# Patient Record
Sex: Female | Born: 1950 | Race: Black or African American | Hispanic: No | Marital: Married | Smoking: Never smoker
Health system: Southern US, Community
[De-identification: ages and names within clinical notes are randomized; demographics above are authoritative.]

## PROBLEM LIST (undated history)

## (undated) DIAGNOSIS — D649 Anemia, unspecified: Secondary | ICD-10-CM

## (undated) DIAGNOSIS — H409 Unspecified glaucoma: Secondary | ICD-10-CM

## (undated) DIAGNOSIS — E079 Disorder of thyroid, unspecified: Secondary | ICD-10-CM

## (undated) DIAGNOSIS — F419 Anxiety disorder, unspecified: Secondary | ICD-10-CM

## (undated) HISTORY — PX: COLONOSCOPY: SHX174

---

## 2011-01-29 ENCOUNTER — Emergency Department (INDEPENDENT_AMBULATORY_CARE_PROVIDER_SITE_OTHER): Payer: BC Managed Care – PPO

## 2011-01-29 ENCOUNTER — Emergency Department (HOSPITAL_BASED_OUTPATIENT_CLINIC_OR_DEPARTMENT_OTHER)
Admission: EM | Admit: 2011-01-29 | Discharge: 2011-01-29 | Disposition: A | Discharge status: Home / Self Care | Payer: BC Managed Care – PPO | Attending: Emergency Medicine | Admitting: Emergency Medicine

## 2011-01-29 DIAGNOSIS — R0789 Other chest pain: Secondary | ICD-10-CM | POA: Insufficient documentation

## 2011-01-29 DIAGNOSIS — R079 Chest pain, unspecified: Secondary | ICD-10-CM

## 2011-01-29 DIAGNOSIS — F411 Generalized anxiety disorder: Secondary | ICD-10-CM | POA: Insufficient documentation

## 2011-01-29 LAB — CBC
HCT: 34.8 % — ABNORMAL LOW (ref 36.0–46.0)
Hemoglobin: 11.5 g/dL — ABNORMAL LOW (ref 12.0–15.0)
MCHC: 33 g/dL (ref 30.0–36.0)
RBC: 3.89 MIL/uL (ref 3.87–5.11)

## 2011-01-29 LAB — DIFFERENTIAL
Basophils Absolute: 0.1 10*3/uL (ref 0.0–0.1)
Basophils Relative: 1 % (ref 0–1)
Lymphocytes Relative: 28 % (ref 12–46)
Monocytes Absolute: 0.5 10*3/uL (ref 0.1–1.0)
Monocytes Relative: 8 % (ref 3–12)
Neutro Abs: 4.1 10*3/uL (ref 1.7–7.7)
Neutrophils Relative %: 64 % (ref 43–77)

## 2011-01-29 LAB — TROPONIN I: Troponin I: <0.3 ng/mL (ref <0.30)

## 2011-01-29 LAB — BASIC METABOLIC PANEL
Calcium: 9.9 mg/dL (ref 8.4–10.5)
Creatinine, Ser: 0.7 mg/dL (ref 0.4–1.2)
GFR calc Af Amer: >60 mL/min (ref >60)
GFR calc non Af Amer: >60 mL/min (ref >60)
Sodium: 140 mEq/L (ref 135–145)

## 2011-01-29 LAB — CK TOTAL AND CKMB (NOT AT ARMC)
CK, MB: 2 ng/mL (ref 0.3–4.0)
Total CK: 137 U/L (ref 7–177)

## 2013-02-26 ENCOUNTER — Other Ambulatory Visit (HOSPITAL_COMMUNITY): Payer: Self-pay | Admitting: Endocrinology

## 2013-02-26 DIAGNOSIS — E0590 Thyrotoxicosis, unspecified without thyrotoxic crisis or storm: Secondary | ICD-10-CM

## 2013-03-12 ENCOUNTER — Encounter (HOSPITAL_COMMUNITY)
Admission: RE | Admit: 2013-03-12 | Discharge: 2013-03-12 | Disposition: A | Discharge status: Home / Self Care | Payer: BC Managed Care – PPO | Source: Ambulatory Visit | Attending: Endocrinology | Admitting: Endocrinology

## 2013-03-12 DIAGNOSIS — E0590 Thyrotoxicosis, unspecified without thyrotoxic crisis or storm: Secondary | ICD-10-CM | POA: Insufficient documentation

## 2013-03-13 ENCOUNTER — Encounter (HOSPITAL_COMMUNITY)
Admission: RE | Admit: 2013-03-13 | Discharge: 2013-03-13 | Disposition: A | Discharge status: Home / Self Care | Payer: BC Managed Care – PPO | Source: Ambulatory Visit | Attending: Endocrinology | Admitting: Endocrinology

## 2013-03-13 ENCOUNTER — Encounter (HOSPITAL_COMMUNITY): Payer: Self-pay

## 2013-03-13 MED ORDER — SODIUM IODIDE I 131 CAPSULE
16.2000 | Freq: Once | INTRAVENOUS | Status: AC | PRN
  Administered 2013-03-12: 16.2 via ORAL

## 2013-03-13 MED ORDER — SODIUM PERTECHNETATE TC 99M INJECTION
11.0000 | Freq: Once | INTRAVENOUS | Status: AC | PRN
  Administered 2013-03-13: 11 via INTRAVENOUS

## 2013-03-25 ENCOUNTER — Other Ambulatory Visit: Payer: Self-pay | Admitting: Endocrinology

## 2013-03-25 DIAGNOSIS — E042 Nontoxic multinodular goiter: Secondary | ICD-10-CM

## 2013-03-26 ENCOUNTER — Other Ambulatory Visit: Payer: Self-pay | Admitting: Endocrinology

## 2013-03-26 DIAGNOSIS — E042 Nontoxic multinodular goiter: Secondary | ICD-10-CM

## 2013-04-01 ENCOUNTER — Other Ambulatory Visit (HOSPITAL_COMMUNITY)
Admission: RE | Admit: 2013-04-01 | Discharge: 2013-04-01 | Disposition: A | Discharge status: Home / Self Care | Payer: BC Managed Care – PPO | Source: Ambulatory Visit | Attending: Interventional Radiology | Admitting: Interventional Radiology

## 2013-04-01 ENCOUNTER — Ambulatory Visit
Admission: RE | Admit: 2013-04-01 | Discharge: 2013-04-01 | Disposition: A | Discharge status: Home / Self Care | Payer: BC Managed Care – PPO | Source: Ambulatory Visit | Attending: Endocrinology | Admitting: Endocrinology

## 2013-04-01 DIAGNOSIS — E042 Nontoxic multinodular goiter: Secondary | ICD-10-CM

## 2013-04-01 DIAGNOSIS — E041 Nontoxic single thyroid nodule: Secondary | ICD-10-CM | POA: Insufficient documentation

## 2013-11-28 ENCOUNTER — Other Ambulatory Visit: Payer: Self-pay | Admitting: Ophthalmology

## 2013-11-28 MED ORDER — TETRACAINE HCL 0.5 % OP SOLN
1.0000 [drp] | OPHTHALMIC | Status: DC
Start: 2013-11-29 — End: 2013-11-28

## 2013-11-28 NOTE — H&P (Signed)
                  History & Physical:   DATE:   11-12-13  NAME:  Marissa Green, Marissa Green     [id number]       HISTORY OF PRESENT ILLNESS: Chief Eye Complaints   Glaucoma  patient  with poor IOP control     ACTIVE PROBLEMS: Mixed mech glaucoma uncontrolled .  The patient's headaches may be secondary to elevated intraocular pressure and or stress.  Patient states her best friend had to have surgery for a ruptured aneurysm. 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   ICD#377.14  Onset: 04/14/2013 10:40  Initial Date:    Primary open angle glaucoma   ICD#365.11  Onset:   Initial Date:   Glaucoma, severe stage   ICD#365.73  Onset:   Initial Date:     IOP still too high OU  suggest glaucoma surgery  patient  hesitant  SURGERIES: SLT OS (N)  08/07/2013  SLT OS temporal 180 degrees 11/07/2012 11:45  MEDICATIONS: Methazolamide 50 mg 2 pills twice a day  Timolol (Timoptic) Ophthalmic Solution:   0.5% solution  SIG-  1 drop(s)   once a day    Brimonidine Tartrate: Strength-  SIG-  ou tid    Lumigan: Strength-  SIG-  1 gtt in each affected eye once a day (in the evening) for 30 days  REVIEW OF SYSTEMS: Negative    TOBACCO: Never smoker   ICD#V13.89   Pick List - Tobacco - Summary  SOCIAL HISTORY: Single.  [employer]   Starter Pick List - Social History  FAMILY HISTORY: Positive family history for  -  Glaucoma Negative family history for  -  Both parents Glaucomca Family History - 1st Degree Relatives:  Son alive and well.  Daughter alive and well.   Family History - 1st Degree Relatives:  ALLERGIES:Starter - Diamox:  nausea & emesis  PHYSICAL EXAMINATION: Exam: GENERAL: Appearance: General appearance can be described as well-nourished, well-developed, and in no acute distress.        VS: BMI: 21.7.  BP: 116/68.  H: 63.00 in.  P: 72 /min.  RR: 16 /min.  W: 122lbs 0oz.     VA  OD:cc 20/30-2 PH 20/NI OS:cc 20/25  PH 20/NI  EYEGLASSES:  OD:-0.50 sph                                              OS:- 0.50 + 0.25 x 032 ADD:+ 1.75 VF:   OD:dense nasal step                                            OS:double arcuate defect  Motility: orthophoria full  PUPILS: 4mm -MG  EYELIDS & OCULAR ADNEXA: normal   SLE: Conjunctiva:  pinguecula  OU  Cornea: arcus  OUand inferior staining each eye   Anterior Chamber:  deep and quiet  OU  Iris:  PERIPHERAL  IRIDECTOMY   OU  Lens: +1  nuclear  sclerosis  OU  Vitreous:  CCT:  Ta   in mmHg     OD:  22 OS:  26 TimE   11/12/2013 16:34   Gonio:09/27/2012 11:03  OD:Inferior PAS / Superior PAS   TM visible nasal and temporal OS: Angle open to TM 360 degrees    Dilation: not done EIOP  Fundus:  optic nerve:  OD:90 % cupping very thin nasal and temporal rim                                                 OS:90 % cupping temporal pallor  thin nasal   Macula:       OD:  clear                                                   OS: clear  Vessels:normal  Periphery: normal    OCT  very thin NFL OU  Exam: GENERAL: Appearance: General appearance can be described as well-nourished, well-developed, and in no acute distress.    HEAD, EARS, NOSE AND THROAT: Ears-Nose (external) Inspection: Externally, nose and ears are normal in appearance and without scars, lesions, or nodules.      Hearing assessment shows no problems with normal conversation.     NECK: Neck tissue exam demonstrates no masses, symmetrical, and trachea is midline.      LUNGS and RESPIRATORY: Lung auscultation elicits no wheezing, rhonci, rales or rubs and with equal breath sounds.    Respiratory effort described as breathing is unlabored and chest movement is symmetrical.    HEART (Cardiovascular): Heart auscultation discovers regular rate and rhythm; no murmur, gallop or rub. Normal heart sounds.    ABDOMEN (Gastrointestinal): Mass/Tenderness Exam: Neither are present.     MUSCULOSKELETAL (BJE): Inspection-Palpation: No major bone, joint,  tendon, or muscle changes.      NEUROLOGICAL: Alert and oriented. No major deficits of coordination or sensation.      PSYCHIATRIC: Insight and judgment appear  both to be intact and appropriate.    Mood and affect are described as normal mood and full affect.    SKIN: Skin Inspection: No rashes or lesions  ADMITTING DIAGNOSIS: Mixed mech glaucoma uncontrolled .  The patient's headaches may be secondary to elevated intraocular pressure and or stress. 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   Preoperative for trabeculectomy. OS     patient  wants to wait until April I explained high  Risk of vision loss.  increase nept to 150mg a day  increase brimonidine to tid   IOP discussed with pt.    ___________________________ Keita Demarco, Jr. Starter - Inactive Problems:  

## 2013-11-28 NOTE — H&P (Signed)
History & Physical:   DATE:   11-12-13  NAME:  Marissa Green, Marissa Green     [id number]       HISTORY OF PRESENT ILLNESS: Chief Eye Complaints   Glaucoma  patient  with poor IOP control     ACTIVE PROBLEMS: Mixed mech glaucoma uncontrolled .  The patient's headaches may be secondary to elevated intraocular pressure and or stress.  Patient states her best friend had to have surgery for a ruptured aneurysm. 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   ICD#377.14  Onset: 04/14/2013 10:40  Initial Date:    Primary open angle glaucoma   ICD#365.11  Onset:   Initial Date:   Glaucoma, severe stage   ICD#365.73  Onset:   Initial Date:     IOP still too high OU  suggest glaucoma surgery  patient  hesitant  SURGERIES: SLT OS (N)  08/07/2013  SLT OS temporal 180 degrees 11/07/2012 11:45  MEDICATIONS: Methazolamide 50 mg 2 pills twice a day  Timolol (Timoptic) Ophthalmic Solution:   0.5% solution  SIG-  1 drop(s)   once a day    Brimonidine Tartrate: Strength-  SIG-  ou tid    Lumigan: Strength-  SIG-  1 gtt in each affected eye once a day (in the evening) for 30 days  REVIEW OF SYSTEMS: Negative    TOBACCO: Never smoker   ICD#V13.89   Pick List - Tobacco - Summary  SOCIAL HISTORY: Single.  [employer]   Starter Pick List - Social History  FAMILY HISTORY: Positive family history for  -  Glaucoma Negative family history for  -  Both parents Glaucomca Family History - 1st Degree Relatives:  Son alive and well.  Daughter alive and well.   Family History - 1st Degree Relatives:  ALLERGIES:Starter - Diamox:  nausea & emesis  PHYSICAL EXAMINATION: Exam: GENERAL: Appearance: General appearance can be described as well-nourished, well-developed, and in no acute distress.        VS: BMI: 21.7.  BP: 116/68.  H: 63.00 in.  P: 72 /min.  RR: 16 /min.  W: 122lbs 0oz.     VA  OD:cc 20/30-2 PH 20/NI OS:cc 20/25  PH 20/NI  EYEGLASSES:  OD:-0.50 sph                                              OS:- 0.50 + 0.25 x 032 ADD:+ 1.75 VF:   UJ:WJXBJ nasal step                                            YN:WGNFAO arcuate defect  Motility: orthophoria full  PUPILS: 4mm -MG  EYELIDS & OCULAR ADNEXA: normal   SLE: Conjunctiva:  pinguecula  OU  Cornea: arcus  OUand inferior staining each eye   Anterior Chamber:  deep and quiet  OU  Iris:  PERIPHERAL  IRIDECTOMY   OU  Lens: +1  nuclear  sclerosis  OU  Vitreous:  CCT:  Ta   in mmHg     OD:  22 OS:  26 TimE   11/12/2013 16:34   Gonio:09/27/2012 11:03  ZH:YQMVHQIO PAS / Superior PAS TM visible nasal and temporal OS: Angle open to TM 360 degrees    Dilation: not done  EIOP  Fundus:  optic nerve:  OD:90 % cupping very thin nasal and temporal rim                                                 OS:90 % cupping temporal pallor  thin nasal   Macula:       OD:  clear                                                   OS: clear  Vessels:normal  Periphery: normal    OCT  very thin NFL OU  Exam: GENERAL: Appearance: General appearance can be described as well-nourished, well-developed, and in no acute distress.    HEAD, EARS, NOSE AND THROAT: Ears-Nose (external) Inspection: Externally, nose and ears are normal in appearance and without scars, lesions, or nodules.      Hearing assessment shows no problems with normal conversation.     NECK: Neck tissue exam demonstrates no masses, symmetrical, and trachea is midline.      LUNGS and RESPIRATORY: Lung auscultation elicits no wheezing, rhonci, rales or rubs and with equal breath sounds.    Respiratory effort described as breathing is unlabored and chest movement is symmetrical.    HEART (Cardiovascular): Heart auscultation discovers regular rate and rhythm; no murmur, gallop or rub. Normal heart sounds.    ABDOMEN (Gastrointestinal): Mass/Tenderness Exam: Neither are present.     MUSCULOSKELETAL (BJE): Inspection-Palpation: No major bone, joint,  tendon, or muscle changes.      NEUROLOGICAL: Alert and oriented. No major deficits of coordination or sensation.      PSYCHIATRIC: Insight and judgment appear  both to be intact and appropriate.    Mood and affect are described as normal mood and full affect.    SKIN: Skin Inspection: No rashes or lesions  ADMITTING DIAGNOSIS: Mixed mech glaucoma uncontrolled .  The patient's headaches may be secondary to elevated intraocular pressure and or stress.  Patient states her best friend had to have surgery for a ruptured aneurysm. 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   Preoperative for trabeculectomy. OS     patient  wants to wait until April I explained high  Risk of vision loss.  increase nept to 150mg  a day  increase brimonidine to tid    iop  discussed with pt    ___________________________ Chalmers Guestoy Shailah Gibbins, Montez HagemanJr. Starter - Inactive Problems:

## 2013-12-01 ENCOUNTER — Encounter (HOSPITAL_COMMUNITY)
Admission: RE | Admit: 2013-12-01 | Discharge: 2013-12-01 | Disposition: A | Discharge status: Home / Self Care | Payer: BC Managed Care – PPO | Source: Ambulatory Visit | Attending: Ophthalmology | Admitting: Ophthalmology

## 2013-12-01 ENCOUNTER — Encounter (HOSPITAL_COMMUNITY): Payer: Self-pay

## 2013-12-01 ENCOUNTER — Other Ambulatory Visit (HOSPITAL_COMMUNITY): Payer: Self-pay | Admitting: *Deleted

## 2013-12-01 HISTORY — DX: Anemia, unspecified: D64.9

## 2013-12-01 LAB — BASIC METABOLIC PANEL
BUN: 15 mg/dL (ref 6–23)
CALCIUM: 9 mg/dL (ref 8.4–10.5)
CO2: 19 meq/L (ref 19–32)
Chloride: 111 mEq/L (ref 96–112)
Creatinine, Ser: 0.61 mg/dL (ref 0.50–1.10)
GFR calc Af Amer: >90 mL/min (ref >90)
GFR calc non Af Amer: >90 mL/min (ref >90)
GLUCOSE: 101 mg/dL — AB (ref 70–99)
Potassium: 4 mEq/L (ref 3.7–5.3)
SODIUM: 144 meq/L (ref 137–147)

## 2013-12-01 LAB — CBC
HCT: 34.1 % — ABNORMAL LOW (ref 36.0–46.0)
Hemoglobin: 11 g/dL — ABNORMAL LOW (ref 12.0–15.0)
MCH: 30.5 pg (ref 26.0–34.0)
MCHC: 32.3 g/dL (ref 30.0–36.0)
MCV: 94.5 fL (ref 78.0–100.0)
PLATELETS: 233 10*3/uL (ref 150–400)
RBC: 3.61 MIL/uL — AB (ref 3.87–5.11)
RDW: 12.5 % (ref 11.5–15.5)
WBC: 5.1 10*3/uL (ref 4.0–10.5)

## 2013-12-01 NOTE — Pre-Procedure Instructions (Signed)
Marissa Green  12/01/2013   Your procedure is scheduled on:  Wednesday, December 03, 2013 at 8:30 AM.   [facility]  Entr[facility]A" Admitting Office at 6:30 AM.   Call this number if you have problems the morning of surgery: [phone]   Remember:   Do not eat food or drink liquids after midnight Tuesday, 12/02/13.   Take these medicines the morning of surgery with A SIP OF WATER: methimazole (TAPAZOLE), May use your eyedrops.  Stop your Vitamins as of today. May resume after surgery   Do not wear jewelry, make-up or nail polish.  Do not wear lotions, powders, or perfumes. You may wear deodorant.  Do not shave 48 hours prior to surgery.   Do not bring valuables to the hospital.  [facility] is not responsible                  for any belongings or valuables.               Contacts, dentures or bridgework may not be worn into surgery.  Leave suitcase in the car. After surgery it may be brought to your room.  For patients admitted to the hospital, discharge time is determined by your                treatment team.               Patients discharged the day of surgery will not be allowed to drive home.  Name and phone number of your driver: Family/friend   Special Instructions: [place] - Preparing for Surgery  Before surgery, you can play an important role.  Because skin is not sterile, your skin needs to be as free of germs as possible.  You can reduce the number of germs on you skin by washing with CHG (chlorahexidine gluconate) soap before surgery.  CHG is an antiseptic cleaner which kills germs and bonds with the skin to continue killing germs even after washing.  Please DO NOT use if you have an allergy to CHG or antibacterial soaps.  If your skin becomes reddened/irritated stop using the CHG and inform your nurse when you arrive at Short Stay.  Do not shave (including legs and underarms) for at least 48 hours prior to the first CHG shower.  You may shave your  face.  Please follow these instructions carefully:   1.  Shower with CHG Soap the night before surgery and the                                morning of Surgery.  2.  If you choose to wash your hair, wash your hair first as usual with your       normal shampoo.  3.  After you shampoo, rinse your hair and body thoroughly to remove the                      Shampoo.  4.  Use CHG as you would any other liquid soap.  You can apply chg directly       to the skin and wash gently with scrungie or a clean washcloth.  5.  Apply the CHG Soap to your body ONLY FROM THE NECK DOWN.        Do not use on open wounds or open sores.  Avoid contact with your eyes, ears, mouth and genitals (private  parts).  Wash genitals (private parts) with your normal soap.  6.  Wash thoroughly, paying special attention to the area where your surgery        will be performed.  7.  Thoroughly rinse your body with warm water from the neck down.  8.  DO NOT shower/wash with your normal soap after using and rinsing off       the CHG Soap.  9.  Pat yourself dry with a clean towel.            10.  Wear clean pajamas.            11.  Place clean sheets on your bed the night of your first shower and do not        sleep with pets.  Day of Surgery  Do not apply any lotions the morning of surgery.  Please wear clean clothes to the hospital/surgery center.     Please read over the following fact sheets that you were given: Pain Booklet, Coughing and Deep Breathing and Surgical Site Infection Prevention

## 2013-12-01 NOTE — Progress Notes (Signed)
Pt's [place]. Cruz [place]ricia [place]  Pt saw Dr. Corwin LevinsWilliam Smith (endocrinologist) last summer and diagnosed with Hyperthyroidism, takes Tapozole daily. Has not had f/u since. She states she thinks Dr. [name] is no longer in town. Will see her PCP in June and thinks she will give her a new referral. She denies any chest pain, palpitations, sob.

## 2013-12-02 MED ORDER — GATIFLOXACIN 0.5 % OP SOLN
1.0000 [drp] | OPHTHALMIC | Status: AC
  Administered 2013-12-03 (×3): 1 [drp] via OPHTHALMIC
  Filled 2013-12-02: qty 2.5

## 2013-12-02 MED ORDER — GATIFLOXACIN 0.5 % OP SOLN: 1.0000 [drp] | OPHTHALMIC | Status: DC

## 2013-12-03 ENCOUNTER — Encounter (HOSPITAL_COMMUNITY): Payer: Self-pay | Admitting: *Deleted

## 2013-12-03 ENCOUNTER — Ambulatory Visit (HOSPITAL_COMMUNITY)
Admission: RE | Admit: 2013-12-03 | Discharge: 2013-12-03 | Disposition: A | Discharge status: Home / Self Care | Payer: BC Managed Care – PPO | Source: Ambulatory Visit | Attending: Ophthalmology | Admitting: Ophthalmology

## 2013-12-03 ENCOUNTER — Encounter (HOSPITAL_COMMUNITY)
Admission: RE | Disposition: A | Discharge status: Home / Self Care | Payer: Self-pay | Source: Ambulatory Visit | Attending: Ophthalmology

## 2013-12-03 ENCOUNTER — Encounter (HOSPITAL_COMMUNITY): Payer: BC Managed Care – PPO | Admitting: Vascular Surgery

## 2013-12-03 ENCOUNTER — Ambulatory Visit (HOSPITAL_COMMUNITY): Payer: BC Managed Care – PPO | Admitting: Anesthesiology

## 2013-12-03 DIAGNOSIS — Z01812 Encounter for preprocedural laboratory examination: Secondary | ICD-10-CM | POA: Insufficient documentation

## 2013-12-03 DIAGNOSIS — D649 Anemia, unspecified: Secondary | ICD-10-CM | POA: Insufficient documentation

## 2013-12-03 DIAGNOSIS — H47239 Glaucomatous optic atrophy, unspecified eye: Secondary | ICD-10-CM | POA: Insufficient documentation

## 2013-12-03 DIAGNOSIS — H409 Unspecified glaucoma: Secondary | ICD-10-CM | POA: Insufficient documentation

## 2013-12-03 DIAGNOSIS — E0590 Thyrotoxicosis, unspecified without thyrotoxic crisis or storm: Secondary | ICD-10-CM | POA: Insufficient documentation

## 2013-12-03 DIAGNOSIS — H4011X0 Primary open-angle glaucoma, stage unspecified: Secondary | ICD-10-CM | POA: Insufficient documentation

## 2013-12-03 HISTORY — PX: MINI SHUNT INSERTION: SHX5337

## 2013-12-03 SURGERY — INSERTION OF MINI SHUNT
Anesthesia: Monitor Anesthesia Care | Site: Eye | Laterality: Left

## 2013-12-03 MED ORDER — LIDOCAINE-EPINEPHRINE 2 %-1:100000 IJ SOLN
INTRAMUSCULAR | Status: DC | PRN
  Administered 2013-12-03: 09:00:00 via RETROBULBAR

## 2013-12-03 MED ORDER — FENTANYL CITRATE 0.05 MG/ML IJ SOLN
INTRAMUSCULAR | Status: AC
  Filled 2013-12-03: qty 5

## 2013-12-03 MED ORDER — 0.9 % SODIUM CHLORIDE (POUR BTL) OPTIME
TOPICAL | Status: DC | PRN
  Administered 2013-12-03: 1000 mL

## 2013-12-03 MED ORDER — EPINEPHRINE HCL 1 MG/ML IJ SOLN
INTRAMUSCULAR | Status: AC
Start: 2013-12-03 — End: 2013-12-03
  Filled 2013-12-03: qty 1

## 2013-12-03 MED ORDER — OXYCODONE HCL 5 MG PO TABS
ORAL_TABLET | ORAL | Status: AC
  Filled 2013-12-03: qty 1

## 2013-12-03 MED ORDER — MITOMYCIN 0.2 MG OP KIT
PACK | OPHTHALMIC | Status: DC | PRN
  Administered 2013-12-03: 0.2 mg via OPHTHALMIC

## 2013-12-03 MED ORDER — FLUORESCEIN SODIUM 1 MG OP STRP
ORAL_STRIP | OPHTHALMIC | Status: AC
  Filled 2013-12-03: qty 1

## 2013-12-03 MED ORDER — LIDOCAINE HCL (CARDIAC) 20 MG/ML IV SOLN
INTRAVENOUS | Status: DC | PRN
  Administered 2013-12-03: 40 mg via INTRAVENOUS

## 2013-12-03 MED ORDER — BSS IO SOLN
INTRAOCULAR | Status: DC | PRN
  Administered 2013-12-03: 15 mL via INTRAOCULAR
  Administered 2013-12-03: 500 mL via INTRAOCULAR

## 2013-12-03 MED ORDER — BUPIVACAINE HCL (PF) 0.75 % IJ SOLN
INTRAMUSCULAR | Status: AC
  Filled 2013-12-03: qty 10

## 2013-12-03 MED ORDER — BSS IO SOLN
INTRAOCULAR | Status: AC
  Filled 2013-12-03: qty 15

## 2013-12-03 MED ORDER — BSS IO SOLN
INTRAOCULAR | Status: AC
  Filled 2013-12-03: qty 500

## 2013-12-03 MED ORDER — HYALURONIDASE HUMAN 150 UNIT/ML IJ SOLN
INTRAMUSCULAR | Status: AC
  Filled 2013-12-03: qty 1

## 2013-12-03 MED ORDER — MIDAZOLAM HCL 2 MG/2ML IJ SOLN
INTRAMUSCULAR | Status: AC
  Filled 2013-12-03: qty 2

## 2013-12-03 MED ORDER — ONDANSETRON HCL 4 MG/2ML IJ SOLN
INTRAMUSCULAR | Status: DC | PRN
  Administered 2013-12-03: 4 mg via INTRAVENOUS

## 2013-12-03 MED ORDER — TETRACAINE HCL 0.5 % OP SOLN
OPHTHALMIC | Status: AC
  Filled 2013-12-03: qty 2

## 2013-12-03 MED ORDER — ACETYLCHOLINE CHLORIDE 1:100 IO SOLR
INTRAOCULAR | Status: AC
  Filled 2013-12-03: qty 1

## 2013-12-03 MED ORDER — SODIUM HYALURONATE 10 MG/ML IO SOLN
INTRAOCULAR | Status: DC | PRN
  Administered 2013-12-03: 0.85 mL via INTRAOCULAR

## 2013-12-03 MED ORDER — TOBRAMYCIN 0.3 % OP OINT
TOPICAL_OINTMENT | OPHTHALMIC | Status: DC | PRN
  Administered 2013-12-03: 1 via OPHTHALMIC

## 2013-12-03 MED ORDER — FENTANYL CITRATE 0.05 MG/ML IJ SOLN: 25.0000 ug | INTRAMUSCULAR | Status: DC | PRN

## 2013-12-03 MED ORDER — TOBRAMYCIN-DEXAMETHASONE 0.3-0.1 % OP OINT
TOPICAL_OINTMENT | OPHTHALMIC | Status: AC
  Filled 2013-12-03: qty 3.5

## 2013-12-03 MED ORDER — PROPOFOL 10 MG/ML IV BOLUS
INTRAVENOUS | Status: AC
  Filled 2013-12-03: qty 20

## 2013-12-03 MED ORDER — MITOMYCIN 0.2 MG OP KIT
0.2000 mg | PACK | Freq: Once | OPHTHALMIC | Status: DC
Start: 2013-12-03 — End: 2013-12-03
  Filled 2013-12-03 (×2): qty 1

## 2013-12-03 MED ORDER — OXYCODONE HCL 5 MG PO TABS
5.0000 mg | ORAL_TABLET | Freq: Once | ORAL | Status: AC | PRN
  Administered 2013-12-03: 5 mg via ORAL

## 2013-12-03 MED ORDER — TRIAMCINOLONE ACETONIDE 40 MG/ML IJ SUSP
INTRAMUSCULAR | Status: AC
  Filled 2013-12-03: qty 5

## 2013-12-03 MED ORDER — MIDAZOLAM HCL 5 MG/5ML IJ SOLN
INTRAMUSCULAR | Status: DC | PRN
  Administered 2013-12-03: 0.5 mg via INTRAVENOUS
  Administered 2013-12-03 (×3): .5 mg via INTRAVENOUS

## 2013-12-03 MED ORDER — PROMETHAZINE HCL 25 MG/ML IJ SOLN: 6.2500 mg | INTRAMUSCULAR | Status: DC | PRN

## 2013-12-03 MED ORDER — FLUORESCEIN SODIUM 1 MG OP STRP
ORAL_STRIP | OPHTHALMIC | Status: DC | PRN
  Administered 2013-12-03: 1 via OPHTHALMIC

## 2013-12-03 MED ORDER — LIDOCAINE-EPINEPHRINE 2 %-1:100000 IJ SOLN
INTRAMUSCULAR | Status: AC
  Filled 2013-12-03: qty 1

## 2013-12-03 MED ORDER — LIDOCAINE HCL 2 % IJ SOLN
INTRAMUSCULAR | Status: AC
  Filled 2013-12-03: qty 20

## 2013-12-03 MED ORDER — TETRACAINE HCL 0.5 % OP SOLN
OPHTHALMIC | Status: DC | PRN
  Administered 2013-12-03: 2 [drp] via OPHTHALMIC

## 2013-12-03 MED ORDER — SODIUM HYALURONATE 10 MG/ML IO SOLN
INTRAOCULAR | Status: AC
  Filled 2013-12-03: qty 0.85

## 2013-12-03 MED ORDER — TRIAMCINOLONE ACETONIDE 40 MG/ML IJ SUSP
INTRAMUSCULAR | Status: DC | PRN
  Administered 2013-12-03: 40 mg

## 2013-12-03 MED ORDER — SODIUM CHLORIDE 0.9 % IV SOLN
INTRAVENOUS | Status: DC | PRN
  Administered 2013-12-03 (×2): via INTRAVENOUS

## 2013-12-03 MED ORDER — LIDOCAINE HCL (CARDIAC) 20 MG/ML IV SOLN
INTRAVENOUS | Status: AC
  Filled 2013-12-03: qty 5

## 2013-12-03 MED ORDER — ATROPINE SULFATE 1 % OP SOLN
OPHTHALMIC | Status: AC
  Filled 2013-12-03: qty 2

## 2013-12-03 MED ORDER — OXYCODONE HCL 5 MG/5ML PO SOLN: 5.0000 mg | Freq: Once | ORAL | Status: AC | PRN

## 2013-12-03 MED ORDER — PROPOFOL 10 MG/ML IV BOLUS
INTRAVENOUS | Status: DC | PRN
  Administered 2013-12-03: 50 mg via INTRAVENOUS

## 2013-12-03 MED ORDER — ROCURONIUM BROMIDE 50 MG/5ML IV SOLN
INTRAVENOUS | Status: AC
  Filled 2013-12-03: qty 1

## 2013-12-03 MED ORDER — ONDANSETRON HCL 4 MG/2ML IJ SOLN
INTRAMUSCULAR | Status: AC
  Filled 2013-12-03: qty 2

## 2013-12-03 SURGICAL SUPPLY — 51 items
APPLICATOR COTTON TIP 6IN STRL (MISCELLANEOUS) IMPLANT
APPLICATOR DR MATTHEWS STRL (MISCELLANEOUS) ×3 IMPLANT
BLADE EYE CATARACT 19 1.4 BEAV (BLADE) ×3 IMPLANT
BLADE STAB KNIFE 45DEG (BLADE) ×3 IMPLANT
BLADE SURG 15 STRL LF DISP TIS (BLADE) IMPLANT
BLADE SURG 15 STRL SS (BLADE)
CANISTER SUCTION 2500CC (MISCELLANEOUS) ×3 IMPLANT
CORDS BIPOLAR (ELECTRODE) ×3 IMPLANT
DRAPE OPHTHALMIC 40X48 W POUCH (DRAPES) ×3 IMPLANT
DRAPE RETRACTOR (MISCELLANEOUS) ×3 IMPLANT
ERASER HMR WETFIELD 23G BP (MISCELLANEOUS) IMPLANT
GLOVE BIO SURGEON STRL SZ8 (GLOVE) ×3 IMPLANT
GLOVE BIO SURGEON STRL SZ8.5 (GLOVE) ×3 IMPLANT
GLOVE ECLIPSE 7.0 STRL STRAW (GLOVE) ×3 IMPLANT
GLOVE SURG SS PI 6.0 STRL IVOR (GLOVE) ×3 IMPLANT
GOWN STRL REUS W/ TWL LRG LVL3 (GOWN DISPOSABLE) ×1 IMPLANT
GOWN STRL REUS W/ TWL XL LVL3 (GOWN DISPOSABLE) ×1 IMPLANT
GOWN STRL REUS W/TWL LRG LVL3 (GOWN DISPOSABLE) ×2
GOWN STRL REUS W/TWL XL LVL3 (GOWN DISPOSABLE) ×2
KIT ROOM TURNOVER OR (KITS) ×3 IMPLANT
KNIFE GRIESHABER SHARP 2.5MM (MISCELLANEOUS) ×3 IMPLANT
MARKER SKIN DUAL TIP RULER LAB (MISCELLANEOUS) IMPLANT
MASK EYE SHIELD (GAUZE/BANDAGES/DRESSINGS) ×3 IMPLANT
NEEDLE 22X1 1/2 (OR ONLY) (NEEDLE) ×3 IMPLANT
NEEDLE 25GX 5/8IN NON SAFETY (NEEDLE) IMPLANT
NEEDLE HYPO 23GX1 LL BLUE HUB (NEEDLE) IMPLANT
NEEDLE HYPO 30X.5 LL (NEEDLE) ×3 IMPLANT
NS IRRIG 1000ML POUR BTL (IV SOLUTION) ×3 IMPLANT
PACK CATARACT CUSTOM (CUSTOM PROCEDURE TRAY) ×3 IMPLANT
PAD ARMBOARD 7.5X6 YLW CONV (MISCELLANEOUS) ×3 IMPLANT
PAD EYE OVAL STERILE LF (GAUZE/BANDAGES/DRESSINGS) ×3 IMPLANT
SHUNT EXPRS GLAUCOMA MINI P200 (Intraocular Lens) ×3 IMPLANT
SPEAR EYE SURG WECK-CEL (MISCELLANEOUS) IMPLANT
SPECIMEN JAR SMALL (MISCELLANEOUS) IMPLANT
SUT ETHILON 10 0 CS140 6 (SUTURE) ×3 IMPLANT
SUT ETHILON 9 0 BV100 4 (SUTURE) IMPLANT
SUT ETHILON 9 0 TG140 8 (SUTURE) IMPLANT
SUT MERSILENE 6 0 S14 DA (SUTURE) IMPLANT
SUT SILK 6 0 G 6 (SUTURE) ×3 IMPLANT
SUT VICRYL 8 0 TG140 8 (SUTURE) IMPLANT
SUT VICRYL 9-0 (SUTURE) ×3 IMPLANT
SYR 20CC LL (SYRINGE) ×6 IMPLANT
SYR 50ML SLIP (SYRINGE) ×3 IMPLANT
SYR TB 1ML LUER SLIP (SYRINGE) IMPLANT
TAPE SURG TRANSPORE 1 IN (GAUZE/BANDAGES/DRESSINGS) ×1 IMPLANT
TAPE SURGICAL TRANSPORE 1 IN (GAUZE/BANDAGES/DRESSINGS) ×2
TOWEL OR 17X24 6PK STRL BLUE (TOWEL DISPOSABLE) ×6 IMPLANT
TUBE CONNECTING 12'X1/4 (SUCTIONS) ×1
TUBE CONNECTING 12X1/4 (SUCTIONS) ×2 IMPLANT
WATER STERILE IRR 1000ML POUR (IV SOLUTION) ×3 IMPLANT
WIPE INSTRUMENT VISIWIPE 73X73 (MISCELLANEOUS) ×3 IMPLANT

## 2013-12-03 NOTE — Discharge Instructions (Signed)
Keep the eye patch on the eye until seen tomorrow. Avoid heavy lifting bending and straining to not lift anything over 25 pounds. Sleep on back or right side did not apply any pressure to the left eye.

## 2013-12-03 NOTE — Op Note (Signed)
Preoperative diagnosis: Uncontrolled mix mechanism glaucoma left eye Postoperative diagnosis: Same Procedure: Insertion of glaucoma device with mitomycin-C left eye Anesthesia: 2% Xylocaine in a 50-50 mixture of 0.75% Marcaine with an ampule Wydase Complications: None Procedure: The patient was taken to the operating room where she was given a peribulbar block with the aforementioned local anesthetic agent. Following this the patient's face was prepped and draped in the usual sterile fashion with a surgeon sitting at the 12:00 position and the operating microscope in position a 6-0 silk suture was passed through clear cornea to infraducted the eye with diet infraducted positioned it was necessary to apply topical tetracaine and topical Xylocaine to the eye to achieve adequate anesthesia. Following this the [facility] forceps were used to grasp the conjunctiva in the superior nasal quadrant an incision was made at the limbus with a Wescott scissors following this using blunt Wescott scissors dissection was carried posteriorly forming a fornix-based conjunctival flap a Tooke blade was used to recess T9 fibers bleeding was controlled with cautery following this a half thickness scleral flap was fashioned using a 45 blade with the base of 4 mm the scleral flap was elevated with the Colibri forceps using a 5700 blade to dissect to the limbus following this the mitomycin-C 0.4 mg per cc was placed onto Gelfoam sponges and allowed to stay under the conjunctiva for 2 minutes the sponges were then removed and the eye was irrigated with 40 cc of balance salt solution. Following this a paracentesis tract was formed at the 4:00 position using the 5100 blade Provisc was then injected in the anterior chamber the anterior chamber remained deep throughout the entire case. Following this the scleral flap was elevated and a 26-gauge needle attached to the Provisc was passed at the base of the limbus and into the anterior chamber a  small amount of Provisc was injected the needle was withdrawn and the express glaucoma filtration device was examined and noted to have no defects the express was aversion P2 100 SN #[id number] it was passed through the tract created by the 26-gauge needle and rotated into position 3 interrupted 10-0 nylon sutures were placed to secure the scleral flap the conjunctiva was then closed with a 9-0 Vicryl suture on a BV 100 needle BSS was injected in the anterior chamber to remove part of the Provisc from the eye the chamber deep and a low bleb was present the bleb was Seidel negative. Therefore a subconjunctival injection of Kenalog 4 mg was given in the inferior nasal subconjunctival space. Following this the fixation suture was removed all instruments were removed from the eye topical TobraDex ointment was applied to the eye. A patch and Fox U. were placed and the patient returned to recovery area in stable condition Chalmers Guestoy Ericberto Padget Junior M.D.

## 2013-12-03 NOTE — Interval H&P Note (Signed)
History and Physical Interval Note:  12/03/2013 8:14 AM  Marissa Green  has presented today for surgery, with the diagnosis of Primary open-angle glaucoma(365.11) [365.11]  The various methods of treatment have been discussed with the patient and family. After consideration of risks, benefits and other options for treatment, the patient has consented to  Procedure(s): INSERTION OF MINI SHUNT LEFT EYE (Left) as a surgical intervention .  The patient's history has been reviewed, patient examined, no change in status, stable for surgery.  I have reviewed the patient's chart and labs.  Questions were answered to the patient's satisfaction.     [name]

## 2013-12-03 NOTE — Anesthesia Postprocedure Evaluation (Signed)
  Anesthesia Post-op Note  Patient: Marissa Green  Procedure(s) Performed: Procedure(s): INSERTION OF MINI SHUNT LEFT EYE (Left)  Patient Location: PACU  Anesthesia Type:MAC  Level of Consciousness: awake and alert   Airway and Oxygen Therapy: Patient Spontanous Breathing  Post-op Pain: none  Post-op Assessment: Post-op Vital signs reviewed  Post-op Vital Signs: stable  Last Vitals:  Filed Vitals:   12/03/13 1045  BP: 100/59  Pulse: 67  Temp:   Resp:     Complications: No apparent anesthesia complications

## 2013-12-03 NOTE — Anesthesia Preprocedure Evaluation (Signed)
Anesthesia Evaluation  Patient identified by MRN, date of birth, ID band Patient awake    Reviewed: Allergy & Precautions, NPO status , Patient's Chart, lab work & pertinent test results  History of Anesthesia Complications Negative for: history of anesthetic complications  Airway Mallampati: I  Neck ROM: Full    Dental   Pulmonary neg pulmonary ROS,  breath sounds clear to auscultation        Cardiovascular negative cardio ROS  Rhythm:Regular Rate:Normal     Neuro/Psych    GI/Hepatic   Endo/Other  Hyperthyroidism   Renal/GU      Musculoskeletal   Abdominal   Peds  Hematology  (+) anemia ,   Anesthesia Other Findings   Reproductive/Obstetrics                           Anesthesia Physical Anesthesia Plan  ASA: II  Anesthesia Plan: MAC   Post-op Pain Management:    Induction: Intravenous  Airway Management Planned: Natural Airway  Additional Equipment:   Intra-op Plan:   Post-operative Plan:   Informed Consent: I have reviewed the patients History and Physical, chart, labs and discussed the procedure including the risks, benefits and alternatives for the proposed anesthesia with the patient or authorized representative who has indicated his/her understanding and acceptance.     Plan Discussed with: CRNA and Surgeon  Anesthesia Plan Comments:         Anesthesia Quick Evaluation

## 2013-12-03 NOTE — H&P (View-Only) (Signed)
History & Physical:   DATE:   11-12-13  NAME:  Marissa Green, Marissa Green     [id number]       HISTORY OF PRESENT ILLNESS: Chief Eye Complaints   Glaucoma  patient  with poor IOP control     ACTIVE PROBLEMS: Mixed mech glaucoma uncontrolled .  The patient's headaches may be secondary to elevated intraocular pressure and or stress.  Patient states her best friend had to have surgery for a ruptured aneurysm. 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   ICD#377.14  Onset: 04/14/2013 10:40  Initial Date:    Primary open angle glaucoma   ICD#365.11  Onset:   Initial Date:   Glaucoma, severe stage   ICD#365.73  Onset:   Initial Date:     IOP still too high OU  suggest glaucoma surgery  patient  hesitant  SURGERIES: SLT OS (N)  08/07/2013  SLT OS temporal 180 degrees 11/07/2012 11:45  MEDICATIONS: Methazolamide 50 mg 2 pills twice a day  Timolol (Timoptic) Ophthalmic Solution:   0.5% solution  SIG-  1 drop(s)   once a day    Brimonidine Tartrate: Strength-  SIG-  ou tid    Lumigan: Strength-  SIG-  1 gtt in each affected eye once a day (in the evening) for 30 days  REVIEW OF SYSTEMS: Negative    TOBACCO: Never smoker   ICD#V13.89   Pick List - Tobacco - Summary  SOCIAL HISTORY: Single.  [employer]   Starter Pick List - Social History  FAMILY HISTORY: Positive family history for  -  Glaucoma Negative family history for  -  Both parents Glaucomca Family History - 1st Degree Relatives:  Son alive and well.  Daughter alive and well.   Family History - 1st Degree Relatives:  ALLERGIES:Starter - Diamox:  nausea & emesis  PHYSICAL EXAMINATION: Exam: GENERAL: Appearance: General appearance can be described as well-nourished, well-developed, and in no acute distress.        VS: BMI: 21.7.  BP: 116/68.  H: 63.00 in.  P: 72 /min.  RR: 16 /min.  W: 122lbs 0oz.     VA  OD:cc 20/30-2 PH 20/NI OS:cc 20/25  PH 20/NI  EYEGLASSES:  OD:-0.50 sph                                              OS:- 0.50 + 0.25 x 032 ADD:+ 1.75 VF:   VH:QIONG nasal step                                            EX:BMWUXL arcuate defect  Motility: orthophoria full  PUPILS: 4mm -MG  EYELIDS & OCULAR ADNEXA: normal   SLE: Conjunctiva:  pinguecula  OU  Cornea: arcus  OUand inferior staining each eye   Anterior Chamber:  deep and quiet  OU  Iris:  PERIPHERAL  IRIDECTOMY   OU  Lens: +1  nuclear  sclerosis  OU  Vitreous:  CCT:  Ta   in mmHg     OD:  22 OS:  26 TimE   11/12/2013 16:34   Gonio:09/27/2012 11:03  KG:MWNUUVOZ PAS / Superior PAS  TM visible nasal and temporal OS: Angle open to TM 360 degrees    Dilation: not done EIOP  Fundus:  optic nerve:  OD:90 % cupping very thin nasal and temporal rim                                                 OS:90 % cupping temporal pallor  thin nasal   Macula:       OD:  clear                                                   OS: clear  Vessels:normal  Periphery: normal    OCT  very thin NFL OU  Exam: GENERAL: Appearance: General appearance can be described as well-nourished, well-developed, and in no acute distress.    HEAD, EARS, NOSE AND THROAT: Ears-Nose (external) Inspection: Externally, nose and ears are normal in appearance and without scars, lesions, or nodules.      Hearing assessment shows no problems with normal conversation.     NECK: Neck tissue exam demonstrates no masses, symmetrical, and trachea is midline.      LUNGS and RESPIRATORY: Lung auscultation elicits no wheezing, rhonci, rales or rubs and with equal breath sounds.    Respiratory effort described as breathing is unlabored and chest movement is symmetrical.    HEART (Cardiovascular): Heart auscultation discovers regular rate and rhythm; no murmur, gallop or rub. Normal heart sounds.    ABDOMEN (Gastrointestinal): Mass/Tenderness Exam: Neither are present.     MUSCULOSKELETAL (BJE): Inspection-Palpation: No major bone, joint,  tendon, or muscle changes.      NEUROLOGICAL: Alert and oriented. No major deficits of coordination or sensation.      PSYCHIATRIC: Insight and judgment appear  both to be intact and appropriate.    Mood and affect are described as normal mood and full affect.    SKIN: Skin Inspection: No rashes or lesions  ADMITTING DIAGNOSIS: Mixed mech glaucoma uncontrolled .  The patient's headaches may be secondary to elevated intraocular pressure and or stress. 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   Preoperative for trabeculectomy. OS     patient  wants to wait until April I explained high  Risk of vision loss.  increase nept to 150mg  a day  increase brimonidine to tid   IOP discussed with pt.    ___________________________ Marissa Green, Marissa HagemanJr. Starter - Inactive Problems:

## 2013-12-03 NOTE — H&P (View-Only) (Signed)
History & Physical:   DATE:   11-12-13  NAME:  Marissa Green, Marissa Green     [id number]       HISTORY OF PRESENT ILLNESS: Chief Eye Complaints   Glaucoma  patient  with poor IOP control     ACTIVE PROBLEMS: Mixed mech glaucoma uncontrolled .  The patient's headaches may be secondary to elevated intraocular pressure and or stress.  Patient states her best friend had to have surgery for a ruptured aneurysm. 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   ICD#377.14  Onset: 04/14/2013 10:40  Initial Date:    Primary open angle glaucoma   ICD#365.11  Onset:   Initial Date:   Glaucoma, severe stage   ICD#365.73  Onset:   Initial Date:     IOP still too high OU  suggest glaucoma surgery  patient  hesitant  SURGERIES: SLT OS (N)  08/07/2013  SLT OS temporal 180 degrees 11/07/2012 11:45  MEDICATIONS: Methazolamide 50 mg 2 pills twice a day  Timolol (Timoptic) Ophthalmic Solution:   0.5% solution  SIG-  1 drop(s)   once a day    Brimonidine Tartrate: Strength-  SIG-  ou tid    Lumigan: Strength-  SIG-  1 gtt in each affected eye once a day (in the evening) for 30 days  REVIEW OF SYSTEMS: Negative    TOBACCO: Never smoker   ICD#V13.89   Pick List - Tobacco - Summary  SOCIAL HISTORY: Single.  [employer]   Starter Pick List - Social History  FAMILY HISTORY: Positive family history for  -  Glaucoma Negative family history for  -  Both parents Glaucomca Family History - 1st Degree Relatives:  Son alive and well.  Daughter alive and well.   Family History - 1st Degree Relatives:  ALLERGIES:Starter - Diamox:  nausea & emesis  PHYSICAL EXAMINATION: Exam: GENERAL: Appearance: General appearance can be described as well-nourished, well-developed, and in no acute distress.        VS: BMI: 21.7.  BP: 116/68.  H: 63.00 in.  P: 72 /min.  RR: 16 /min.  W: 122lbs 0oz.     VA  OD:cc 20/30-2 PH 20/NI OS:cc 20/25  PH 20/NI  EYEGLASSES:  OD:-0.50 sph                                              OS:- 0.50 + 0.25 x 032 ADD:+ 1.75 VF:   UJ:WJXBJ nasal step                                            YN:WGNFAO arcuate defect  Motility: orthophoria full  PUPILS: 4mm -MG  EYELIDS & OCULAR ADNEXA: normal   SLE: Conjunctiva:  pinguecula  OU  Cornea: arcus  OUand inferior staining each eye   Anterior Chamber:  deep and quiet  OU  Iris:  PERIPHERAL  IRIDECTOMY   OU  Lens: +1  nuclear  sclerosis  OU  Vitreous:  CCT:  Ta   in mmHg     OD:  22 OS:  26 TimE   11/12/2013 16:34   Gonio:09/27/2012 11:03  ZH:YQMVHQIO PAS / Superior PAS TM visible nasal and temporal OS: Angle open to TM 360 degrees    Dilation: not done  EIOP  Fundus:  optic nerve:  OD:90 % cupping very thin nasal and temporal rim                                                 OS:90 % cupping temporal pallor  thin nasal   Macula:       OD:  clear                                                   OS: clear  Vessels:normal  Periphery: normal    OCT  very thin NFL OU  Exam: GENERAL: Appearance: General appearance can be described as well-nourished, well-developed, and in no acute distress.    HEAD, EARS, NOSE AND THROAT: Ears-Nose (external) Inspection: Externally, nose and ears are normal in appearance and without scars, lesions, or nodules.      Hearing assessment shows no problems with normal conversation.     NECK: Neck tissue exam demonstrates no masses, symmetrical, and trachea is midline.      LUNGS and RESPIRATORY: Lung auscultation elicits no wheezing, rhonci, rales or rubs and with equal breath sounds.    Respiratory effort described as breathing is unlabored and chest movement is symmetrical.    HEART (Cardiovascular): Heart auscultation discovers regular rate and rhythm; no murmur, gallop or rub. Normal heart sounds.    ABDOMEN (Gastrointestinal): Mass/Tenderness Exam: Neither are present.     MUSCULOSKELETAL (BJE): Inspection-Palpation: No major bone, joint,  tendon, or muscle changes.      NEUROLOGICAL: Alert and oriented. No major deficits of coordination or sensation.      PSYCHIATRIC: Insight and judgment appear  both to be intact and appropriate.    Mood and affect are described as normal mood and full affect.    SKIN: Skin Inspection: No rashes or lesions  ADMITTING DIAGNOSIS: Mixed mech glaucoma uncontrolled .  The patient's headaches may be secondary to elevated intraocular pressure and or stress.  Patient states her best friend had to have surgery for a ruptured aneurysm. 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   Preoperative for trabeculectomy. OS     patient  wants to wait until April I explained high  Risk of vision loss.  increase nept to 150mg  a day  increase brimonidine to tid    iop  discussed with pt    ___________________________ Chalmers Guestoy Reizel Calzada, Montez HagemanJr. Starter - Inactive Problems:

## 2013-12-03 NOTE — Transfer of Care (Signed)
Immediate Anesthesia Transfer of Care Note  Patient: Marissa Green  Procedure(s) Performed: Procedure(s): INSERTION OF MINI SHUNT LEFT EYE (Left)  Patient Location: PACU  Anesthesia Type:MAC  Level of Consciousness: awake and alert   Airway & Oxygen Therapy: Patient Spontanous Breathing  Post-op Assessment: Report given to PACU RN  Post vital signs: Reviewed and stable  Complications: No apparent anesthesia complications

## 2013-12-03 NOTE — Interval H&P Note (Signed)
History and Physical Interval Note:  12/03/2013 8:15 AM  Marissa Green  has presented today for surgery, with the diagnosis of Primary open-angle glaucoma(365.11) [365.11]  The various methods of treatment have been discussed with the patient and family. After consideration of risks, benefits and other options for treatment, the patient has consented to  Procedure(s): INSERTION OF MINI SHUNT LEFT EYE (Left) as a surgical intervention .  The patient's history has been reviewed, patient examined, no change in status, stable for surgery.  I have reviewed the patient's chart and labs.  Questions were answered to the patient's satisfaction.     Chalmers Guestoy Asencion Loveday

## 2013-12-04 ENCOUNTER — Encounter (HOSPITAL_COMMUNITY): Payer: Self-pay | Admitting: Ophthalmology

## 2014-10-03 ENCOUNTER — Emergency Department (HOSPITAL_COMMUNITY)
Admission: EM | Admit: 2014-10-03 | Discharge: 2014-10-03 | Disposition: A | Discharge status: Home / Self Care | Payer: BC Managed Care – PPO | Attending: Emergency Medicine | Admitting: Emergency Medicine

## 2014-10-03 ENCOUNTER — Encounter (HOSPITAL_COMMUNITY): Payer: Self-pay

## 2014-10-03 DIAGNOSIS — Y9389 Activity, other specified: Secondary | ICD-10-CM | POA: Diagnosis not present

## 2014-10-03 DIAGNOSIS — Z8639 Personal history of other endocrine, nutritional and metabolic disease: Secondary | ICD-10-CM | POA: Insufficient documentation

## 2014-10-03 DIAGNOSIS — Y92410 Unspecified street and highway as the place of occurrence of the external cause: Secondary | ICD-10-CM | POA: Diagnosis not present

## 2014-10-03 DIAGNOSIS — R51 Headache: Secondary | ICD-10-CM

## 2014-10-03 DIAGNOSIS — S0990XA Unspecified injury of head, initial encounter: Secondary | ICD-10-CM | POA: Diagnosis present

## 2014-10-03 DIAGNOSIS — S46812A Strain of other muscles, fascia and tendons at shoulder and upper arm level, left arm, initial encounter: Secondary | ICD-10-CM | POA: Insufficient documentation

## 2014-10-03 DIAGNOSIS — Y998 Other external cause status: Secondary | ICD-10-CM | POA: Diagnosis not present

## 2014-10-03 DIAGNOSIS — Z862 Personal history of diseases of the blood and blood-forming organs and certain disorders involving the immune mechanism: Secondary | ICD-10-CM | POA: Insufficient documentation

## 2014-10-03 DIAGNOSIS — R519 Headache, unspecified: Secondary | ICD-10-CM

## 2014-10-03 MED ORDER — NAPROXEN 500 MG PO TABS: 500.0000 mg | ORAL_TABLET | Freq: Two times a day (BID) | ORAL | Status: DC

## 2014-10-03 MED ORDER — CYCLOBENZAPRINE HCL 10 MG PO TABS: 10.0000 mg | ORAL_TABLET | Freq: Two times a day (BID) | ORAL | Status: DC | PRN

## 2014-10-03 MED ORDER — HYDROCODONE-ACETAMINOPHEN 5-325 MG PO TABS
1.0000 | ORAL_TABLET | Freq: Once | ORAL | Status: AC
  Administered 2014-10-03: 1 via ORAL
  Filled 2014-10-03: qty 1

## 2014-10-03 MED ORDER — IBUPROFEN 200 MG PO TABS
600.0000 mg | ORAL_TABLET | ORAL | Status: AC
  Administered 2014-10-03: 600 mg via ORAL
  Filled 2014-10-03: qty 3
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
## 2015-02-11 ENCOUNTER — Other Ambulatory Visit: Payer: Self-pay | Admitting: Ophthalmology

## 2015-02-11 MED ORDER — TETRACAINE HCL 0.5 % OP SOLN: 1.0000 [drp] | OPHTHALMIC | Status: AC

## 2015-02-11 NOTE — H&P (Signed)
History & Physical:   DATE:   02-01-15  NAME:  Marissa Green, Marissa Green     [id number]       HISTORY OF PRESENT ILLNESS: Chief Eye Complaints   Glaucoma  patient: Patient c/o having a fbs in OU at times. Va seems to be stable per pt. Patient c/o OS being red and swollen last month on the 13th, had to used the zylet.     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  ICD9:   Onset: 06/08/2014 11:01  Initial Date:   EIOP OD  Primary open-angle glaucoma, severe stage   ICD10: H40.11X3  ICD9:    UNCONTROLLED OD WITH PROGRESSIVE VISUAL FIELD LOSS DESPITE MAXIUM TOLERATED MEDICAL THERAPY.Initial Date:  SURGERIES:   trabeculectomy  with glaucoma device OS 12-03-13 suturelysis  x's 2 OS 4-9 & 12-05-2013 OS SLT OS (N)  08/07/2013  SLT OS temporal 180 degrees 11/07/2012 11:45  MEDICATIONS: Zylet: 0.5%-0.3% suspension SIG-  1 gtt OS BID for 2 weeks then 1 gtt OS for 2 week then stop    Lumigan: 0.01% solution SIG-  1 gtt in each affected eye once a day (in the evening) for 30 days  OD  Brimonidine Tartrate: 0.2% solution SIG-  1 drop OD tid    Timolol (Timoptic) Ophthalmic Solution:   0.5% solution  SIG-  1 drop(s)   once a day   Methazolamide1 pill  BID  REVIEW OF SYSTEMS: ROS:   GEN- Constitutional: normal PSYCH/Psychiatric:    Is the pt oriented to time, place, person? yes  Mood  normal   TOBACCO: Never smoker   ICD10: Z87.898 ICD9: V13.89   Pick List - Tobacco - Summary    Smoker Status:  SOCIAL HISTORY: Single.  [employer]   Starter Pick List - Social History  FAMILY HISTORY: Positive family history for  -  Glaucoma Both parents Glaucomca CHILDREN:Family History - 1st Degree Relatives:  Son alive and well. Daughter alive and well.    ALLERGIES: Diamox:  nausea &  emesis  PHYSICAL EXAMINATION: Exam: GENERAL: Appearance: General appearance can be described as well-nourished, well-developed, and in no acute distress.        VS: BMI: 21.7.  BP: 116/68.  H: 63.00 in.  P: 72 /min.  RR: 16 /min.  W: 122lbs 0oz.     VA OD: CC: 20/20- OS: CC: 20/20- PH 20/NI  EYEGLASSES:  OD:-0.50 sph    OS:- 0.50 + 0.25 x 032 ADD:+ 1.75  MR: 02/11/2014 16:16  OD: -1.00 +0.25 x173 20/20 OS: -1.50 +1.50 x119 20/25 ADD: +1.75  VF:   ZG:YFVCB nasal step                                            SW:HQPRFF arcuate defect  Motility: orthophoria full  PUPILS: 105mm -MG  EYELIDS & OCULAR ADNEXA: normal OD , clogged gland OD upper lid   SLE: Conjunctiva:  OS elevated cystic bleb - leak  -siedel  Cornea: arcus  OU and  inferior staining OD   Decreased Tear Break-Up Time,  1+ staining OD    Anterior Chamber:  deep and quiet  OD,     tube in good position with trace cell & flair OS  OS  Iris:  PERIPHERAL  IRIDECTOMY   OU  Lens: +1  nuclear  sclerosis  OU  Ta   in mmHg     OD: 24 OS:  17,18 Time 02/01/2015 11:01   Gonio:02/01/2015 11:04  UE:AVWUJWJX PAS / Superior PAS TM visible nasal and temporal angle open 360 degrees to TM, superior angle very shallow, can only visualize anterior TM  OS: Angle open to TM 360 degrees, tube resting on iris    Dilation: not done EIOP  Fundus:  optic nerve:  OD:90 % cupping very thin nasal and temporal rim  OS:90 % cupping temporal pallor  thin nasal  Macula:  normal OU  Vessels: normal OU  Periphery: normal OU     Visual Fields   OD:  Double Bjerrum appears unchanged compared to last test  OS: Stable  PERG ABNORMAL OU  Exam: GENERAL: Appearance: HEAD, EARS, NOSE AND THROAT: Ears-Nose (external) Inspection: Externally, nose and ears are normal in appearance and without scars, lesions, or nodules.      Hearing assessment shows no problems with normal conversation.      LUNGS and RESPIRATORY: Lung auscultation  elicits no wheezing, rhonci, rales or rubs and with equal breath sounds.    Respiratory effort described as breathing is unlabored and chest movement is symmetrical.    HEART (Cardiovascular): Heart auscultation discovers regular rate and rhythm; no murmur, gallop or rub. Normal heart sounds.    ABDOMEN (Gastrointestinal): Mass/Tenderness Exam: Neither are present.     MUSCULOSKELETAL (BJE): Inspection-Palpation: No major bone, joint, tendon, or muscle changes.      NEUROLOGICAL: Alert and oriented. No major deficits of coordination or sensation.      PSYCHIATRIC: Insight and judgment appear  both to be intact and appropriate.    Mood and affect are described as normal mood and full affect.    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  SURGICAL TREATMENT PLAN: trabeculectomy  with  PERIPHERAL  IRIDECTOMY   with [facility] OD    Risk and benefits of surgery have been reviewed with the patient and the patient agrees to proceed with the surgical procedure. The possibility   of vision loss w EIOP discussed w  patient     ___________________________ Chalmers Guest, Montez Hageman.

## 2015-02-16 ENCOUNTER — Encounter (HOSPITAL_COMMUNITY): Payer: Self-pay | Admitting: *Deleted

## 2015-02-17 ENCOUNTER — Ambulatory Visit (HOSPITAL_COMMUNITY)
Admission: RE | Admit: 2015-02-17 | Discharge: 2015-02-17 | Disposition: A | Discharge status: Home / Self Care | Payer: BC Managed Care – PPO | Source: Ambulatory Visit | Attending: Ophthalmology | Admitting: Ophthalmology

## 2015-02-17 ENCOUNTER — Encounter (HOSPITAL_COMMUNITY): Payer: Self-pay | Admitting: Critical Care Medicine

## 2015-02-17 ENCOUNTER — Ambulatory Visit (HOSPITAL_COMMUNITY): Payer: BC Managed Care – PPO | Admitting: Critical Care Medicine

## 2015-02-17 ENCOUNTER — Encounter (HOSPITAL_COMMUNITY)
Admission: RE | Disposition: A | Discharge status: Home / Self Care | Payer: Self-pay | Source: Ambulatory Visit | Attending: Ophthalmology

## 2015-02-17 DIAGNOSIS — H409 Unspecified glaucoma: Secondary | ICD-10-CM | POA: Insufficient documentation

## 2015-02-17 DIAGNOSIS — E0590 Thyrotoxicosis, unspecified without thyrotoxic crisis or storm: Secondary | ICD-10-CM | POA: Insufficient documentation

## 2015-02-17 HISTORY — DX: Unspecified glaucoma: H40.9

## 2015-02-17 HISTORY — PX: MITOMYCIN C APPLICATION: SHX6375

## 2015-02-17 HISTORY — PX: TRABECULECTOMY: SHX107

## 2015-02-17 LAB — CBC
HEMATOCRIT: 34.7 % — AB (ref 36.0–46.0)
HEMOGLOBIN: 11 g/dL — AB (ref 12.0–15.0)
MCH: 30.1 pg (ref 26.0–34.0)
MCHC: 31.7 g/dL (ref 30.0–36.0)
MCV: 95.1 fL (ref 78.0–100.0)
Platelets: 245 10*3/uL (ref 150–400)
RBC: 3.65 MIL/uL — ABNORMAL LOW (ref 3.87–5.11)
RDW: 12.5 % (ref 11.5–15.5)
WBC: 5.8 10*3/uL (ref 4.0–10.5)

## 2015-02-17 SURGERY — TRABECULECTOMY
Anesthesia: Monitor Anesthesia Care | Site: Eye | Laterality: Right

## 2015-02-17 MED ORDER — ONDANSETRON HCL 4 MG/2ML IJ SOLN
INTRAMUSCULAR | Status: DC | PRN
  Administered 2015-02-17: 4 mg via INTRAVENOUS

## 2015-02-17 MED ORDER — TOBRAMYCIN-DEXAMETHASONE 0.3-0.1 % OP OINT
TOPICAL_OINTMENT | OPHTHALMIC | Status: AC
  Filled 2015-02-17: qty 3.5

## 2015-02-17 MED ORDER — EPINEPHRINE HCL 1 MG/ML IJ SOLN
INTRAMUSCULAR | Status: AC
Start: 2015-02-17 — End: 2015-02-17
  Filled 2015-02-17: qty 1

## 2015-02-17 MED ORDER — TRIAMCINOLONE ACETONIDE 40 MG/ML IJ SUSP
INTRAMUSCULAR | Status: AC
  Filled 2015-02-17: qty 5

## 2015-02-17 MED ORDER — SODIUM HYALURONATE 10 MG/ML IO SOLN
INTRAOCULAR | Status: DC | PRN
  Administered 2015-02-17: 0.85 mL via INTRAOCULAR

## 2015-02-17 MED ORDER — ATROPINE SULFATE 1 % OP SOLN
OPHTHALMIC | Status: AC
Start: 2015-02-17 — End: 2015-02-17
  Filled 2015-02-17: qty 5

## 2015-02-17 MED ORDER — BSS IO SOLN
INTRAOCULAR | Status: AC
  Filled 2015-02-17: qty 500

## 2015-02-17 MED ORDER — LIDOCAINE HCL 2 % IJ SOLN
INTRAMUSCULAR | Status: AC
  Filled 2015-02-17: qty 20

## 2015-02-17 MED ORDER — 0.9 % SODIUM CHLORIDE (POUR BTL) OPTIME
TOPICAL | Status: DC | PRN
  Administered 2015-02-17: 100 mL

## 2015-02-17 MED ORDER — SODIUM HYALURONATE 10 MG/ML IO SOLN
INTRAOCULAR | Status: AC
  Filled 2015-02-17: qty 0.85

## 2015-02-17 MED ORDER — MITOMYCIN 0.2 MG OP KIT
PACK | OPHTHALMIC | Status: DC | PRN
  Administered 2015-02-17: .4 mg via OPHTHALMIC

## 2015-02-17 MED ORDER — ATROPINE SULFATE 1 % OP SOLN
OPHTHALMIC | Status: DC | PRN
  Administered 2015-02-17: 1 [drp] via OPHTHALMIC

## 2015-02-17 MED ORDER — GATIFLOXACIN 0.5 % OP SOLN
1.0000 [drp] | OPHTHALMIC | Status: AC | PRN
  Administered 2015-02-17 (×3): 1 [drp] via OPHTHALMIC
  Filled 2015-02-17: qty 2.5

## 2015-02-17 MED ORDER — ACETYLCHOLINE CHLORIDE 20 MG IO SOLR
INTRAOCULAR | Status: AC
  Filled 2015-02-17: qty 1

## 2015-02-17 MED ORDER — ACETYLCHOLINE CHLORIDE 20 MG IO SOLR
INTRAOCULAR | Status: DC | PRN
  Administered 2015-02-17: 20 mg via INTRAOCULAR

## 2015-02-17 MED ORDER — MITOMYCIN 0.2 MG OP KIT
0.2000 mg | PACK | Freq: Once | OPHTHALMIC | Status: DC
  Filled 2015-02-17: qty 1

## 2015-02-17 MED ORDER — FENTANYL CITRATE (PF) 250 MCG/5ML IJ SOLN
INTRAMUSCULAR | Status: AC
  Filled 2015-02-17: qty 5

## 2015-02-17 MED ORDER — SODIUM CHLORIDE 0.9 % IV SOLN
INTRAVENOUS | Status: DC
  Administered 2015-02-17: 10 mL/h via INTRAVENOUS

## 2015-02-17 MED ORDER — TOBRAMYCIN 0.3 % OP OINT
TOPICAL_OINTMENT | OPHTHALMIC | Status: DC | PRN
  Administered 2015-02-17: 1 via OPHTHALMIC

## 2015-02-17 MED ORDER — MIDAZOLAM HCL 2 MG/2ML IJ SOLN
INTRAMUSCULAR | Status: AC
  Filled 2015-02-17: qty 2

## 2015-02-17 MED ORDER — HYALURONIDASE HUMAN 150 UNIT/ML IJ SOLN
INTRAMUSCULAR | Status: AC
  Filled 2015-02-17: qty 1

## 2015-02-17 MED ORDER — TETRACAINE HCL 0.5 % OP SOLN
OPHTHALMIC | Status: AC
  Filled 2015-02-17: qty 2

## 2015-02-17 MED ORDER — TRIAMCINOLONE ACETONIDE 40 MG/ML IJ SUSP
INTRAMUSCULAR | Status: DC | PRN
  Administered 2015-02-17: .2 mL

## 2015-02-17 MED ORDER — PREDNISOLONE ACETATE 1 % OP SUSP
1.0000 [drp] | OPHTHALMIC | Status: AC
  Administered 2015-02-17 (×3): 1 [drp] via OPHTHALMIC
  Filled 2015-02-17: qty 5

## 2015-02-17 MED ORDER — BUPIVACAINE HCL (PF) 0.75 % IJ SOLN
INTRAMUSCULAR | Status: AC
  Filled 2015-02-17: qty 10

## 2015-02-17 MED ORDER — FLUORESCEIN SODIUM 1 MG OP STRP
ORAL_STRIP | OPHTHALMIC | Status: AC
  Filled 2015-02-17: qty 1

## 2015-02-17 MED ORDER — MIDAZOLAM HCL 5 MG/5ML IJ SOLN
INTRAMUSCULAR | Status: DC | PRN
  Administered 2015-02-17 (×2): 0.5 mg via INTRAVENOUS
  Administered 2015-02-17: 1 mg via INTRAVENOUS

## 2015-02-17 MED ORDER — FLUORESCEIN SODIUM 1 MG OP STRP
ORAL_STRIP | OPHTHALMIC | Status: DC | PRN
  Administered 2015-02-17: 1 via OPHTHALMIC

## 2015-02-17 MED ORDER — PROPOFOL 10 MG/ML IV BOLUS
INTRAVENOUS | Status: DC | PRN
  Administered 2015-02-17: 20 mg via INTRAVENOUS
  Administered 2015-02-17: 30 mg via INTRAVENOUS

## 2015-02-17 MED ORDER — BSS IO SOLN
INTRAOCULAR | Status: DC | PRN
  Administered 2015-02-17: 15 mL

## 2015-02-17 MED ORDER — LIDOCAINE-EPINEPHRINE 2 %-1:100000 IJ SOLN
INTRAMUSCULAR | Status: AC
  Filled 2015-02-17: qty 1

## 2015-02-17 MED ORDER — BSS IO SOLN
INTRAOCULAR | Status: AC
  Filled 2015-02-17: qty 15

## 2015-02-17 MED ORDER — LIDOCAINE-EPINEPHRINE 2 %-1:100000 IJ SOLN
INTRAMUSCULAR | Status: DC | PRN
  Administered 2015-02-17: 5 mL via RETROBULBAR

## 2015-02-17 SURGICAL SUPPLY — 48 items
23G NEEDLE ×3 IMPLANT
APPLICATOR COTTON TIP 6IN STRL (MISCELLANEOUS) ×3 IMPLANT
APPLICATOR DR MATTHEWS STRL (MISCELLANEOUS) ×3 IMPLANT
BLADE EYE CATARACT 19 1.4 BEAV (BLADE) ×3 IMPLANT
BLADE MINI RND TIP GREEN BEAV (BLADE) IMPLANT
BLADE STAB KNIFE 45DEG (BLADE) ×3 IMPLANT
CANISTER SUCTION 2500CC (MISCELLANEOUS) IMPLANT
CANNULA ANT/CHMB 27GA (MISCELLANEOUS) ×3 IMPLANT
CORDS BIPOLAR (ELECTRODE) ×3 IMPLANT
COVER MAYO STAND STRL (DRAPES) IMPLANT
DRAPE OPHTHALMIC 40X48 W POUCH (DRAPES) ×3 IMPLANT
DRAPE RETRACTOR (MISCELLANEOUS) ×3 IMPLANT
ERASER HMR WETFIELD 23G BP (MISCELLANEOUS) IMPLANT
GLOVE BIO SURGEON STRL SZ8 (GLOVE) ×6 IMPLANT
GLOVE ECLIPSE 7.0 STRL STRAW (GLOVE) ×3 IMPLANT
GLOVE ECLIPSE 7.5 STRL STRAW (GLOVE) ×3 IMPLANT
GOWN STRL REIN XL XLG (GOWN DISPOSABLE) ×9 IMPLANT
GOWN STRL REUS W/ TWL LRG LVL3 (GOWN DISPOSABLE) IMPLANT
GOWN STRL REUS W/ TWL XL LVL3 (GOWN DISPOSABLE) IMPLANT
GOWN STRL REUS W/TWL LRG LVL3 (GOWN DISPOSABLE)
GOWN STRL REUS W/TWL XL LVL3 (GOWN DISPOSABLE)
KIT BASIN OR (CUSTOM PROCEDURE TRAY) ×3 IMPLANT
KIT ROOM TURNOVER OR (KITS) ×3 IMPLANT
KNIFE GRIESHABER SHARP 2.5MM (MISCELLANEOUS) ×3 IMPLANT
NEEDLE 25GX 5/8IN NON SAFETY (NEEDLE) ×3 IMPLANT
NEEDLE FILTER BLUNT 18X 1/2SAF (NEEDLE) ×2
NEEDLE FILTER BLUNT 18X1 1/2 (NEEDLE) ×1 IMPLANT
NEEDLE HYPO 30X.5 LL (NEEDLE) ×3 IMPLANT
NS IRRIG 1000ML POUR BTL (IV SOLUTION) ×3 IMPLANT
PACK CATARACT CUSTOM (CUSTOM PROCEDURE TRAY) ×3 IMPLANT
PAD ARMBOARD 7.5X6 YLW CONV (MISCELLANEOUS) ×3 IMPLANT
PAK PIK CVS CATARACT (OPHTHALMIC) ×3 IMPLANT
SPEAR EYE SURG WECK-CEL (MISCELLANEOUS) IMPLANT
SUT ETHILON 10 0 CS140 6 (SUTURE) ×3 IMPLANT
SUT ETHILON 9 0 BV100 4 (SUTURE) ×3 IMPLANT
SUT ETHILON 9 0 TG140 8 (SUTURE) ×3 IMPLANT
SUT SILK 6 0 G 6 (SUTURE) ×3 IMPLANT
SUT VICRYL 9-0 (SUTURE) ×3 IMPLANT
SYR 50ML SLIP (SYRINGE) ×3 IMPLANT
SYR TB 1ML LUER SLIP (SYRINGE) IMPLANT
SYRINGE 20CC LL (MISCELLANEOUS) ×6 IMPLANT
TIP ABS 45DEG FLARED 0.9MM (TIP) ×3 IMPLANT
TOWEL OR 17X24 6PK STRL BLUE (TOWEL DISPOSABLE) ×6 IMPLANT
TOWEL OR 17X26 10 PK STRL BLUE (TOWEL DISPOSABLE) ×3 IMPLANT
TUBE CONNECTING 12'X1/4 (SUCTIONS)
TUBE CONNECTING 12X1/4 (SUCTIONS) IMPLANT
WATER STERILE IRR 1000ML POUR (IV SOLUTION) ×3 IMPLANT
WIPE INSTRUMENT VISIWIPE 73X73 (MISCELLANEOUS) ×3 IMPLANT

## 2015-02-17 NOTE — Anesthesia Postprocedure Evaluation (Signed)
  Anesthesia Post-op Note  Patient: Marissa Green  Procedure(s) Performed: Procedure(s): TRABECULECTOMY RIGHT EYE (Right) MITOMYCIN C APPLICATION RIGHT EYE (Right)  Patient Location: PACU  Anesthesia Type: MAC  Level of Consciousness: awake and alert   Airway and Oxygen Therapy: Patient Spontanous Breathing  Post-op Pain: none  Post-op Assessment: Post-op Vital signs reviewed, Patient's Cardiovascular Status Stable and Respiratory Function Stable  Post-op Vital Signs: Reviewed  Filed Vitals:   02/17/15 1340  BP: 118/57  Pulse: 63  Temp:   Resp: 18    Complications: No apparent anesthesia complications

## 2015-02-17 NOTE — Interval H&P Note (Signed)
History and Physical Interval Note:  02/17/2015 11:56 AM  Marissa Green  has presented today for surgery, with the diagnosis of OPEN ANGLE GLAUCOMA SEVERE STAGE  The various methods of treatment have been discussed with the patient and family. After consideration of risks, benefits and other options for treatment, the patient has consented to  Procedure(s): TRABECULECTOMY RIGHT EYE (Right) MITOMYCIN C APPLICATION RIGHT EYE (Right) as a surgical intervention .  The patient's history has been reviewed, patient examined, no change in status, stable for surgery.  I have reviewed the patient's chart and labs.  Questions were answered to the patient's satisfaction.     Jaquanda Wickersham

## 2015-02-17 NOTE — Transfer of Care (Signed)
Immediate Anesthesia Transfer of Care Note  Patient: Marissa Green  Procedure(s) Performed: Procedure(s): TRABECULECTOMY RIGHT EYE (Right) MITOMYCIN C APPLICATION RIGHT EYE (Right)  Patient Location: PACU  Anesthesia Type:MAC  Level of Consciousness: awake, alert  and oriented  Airway & Oxygen Therapy: Patient Spontanous Breathing  Post-op Assessment: Report given to RN, Post -op Vital signs reviewed and stable and Patient moving all extremities  Post vital signs: Reviewed and stable  Last Vitals:  Filed Vitals:   02/17/15 0918  BP: 151/58  Pulse: 63  Temp: 36.3 C  Resp: 20   HR 61, RR 14, Sats 100% on RA, BP 124/57  Complications: No apparent anesthesia complications

## 2015-02-17 NOTE — Op Note (Signed)
Preoperative diagnosis: Uncontrolled mixed mechanism glaucoma right eye Postoperative diagnosis: Same Procedure: Trabeculectomy with mitomycin-C with peripheral iridectomy right eye Complications: None Anesthesia: 2% Xylocaine with epinephrine in a 50-50 mixture 0.75% Marcaine with ample [place] Assistant: Mindy Procedure: The patient was transported to the operating room where she was given a peribulbar block with the aforementioned local and aesthetic agent pressure was applied to the eye. The patient's face was then prepped and draped in the usual sterile fashion. With the surgeon sitting at the 12:00 position and the operating microscope in position a muscle hook was used to infraducted the eye and a 6-0 nylon suture was passed through clear cornea to infraducted the eye with diet infraducted positioned an incision was made at the limbus using the Wescott scissors and the Hoskins forceps the blunt Wescott's was then used to form a fornix-based conjunctival flap the Tooke blade was used to recess T9 fibers and bleeding was controlled with cautery. A 45 blade was used to fashion a half thickness scleral flap using a mom and a Colibri forceps and the grease Hopper blade dissection was made to the limbus to elevate the scleral flap. Following this mitomycin-C 0.4 mg/mL was placed onto Gelfoam sponges and allowed to stay under the conjunctiva for 2-1/2 minutes. The sponges were then removed and eye was irrigated with 40 mL of balanced salt solution following this a paracentesis tract was formed through clear cornea using a Wheeler blade at the 7:30 position Provisc was injected in the anterior chamber. Following this a scleral flap was elevated a 45 blade was used to into the anterior chamber under the scleral flap using a Descemet's punch a 1 x 3 block of tissue was removed and the iris prolapse was noted at this point. A basilar peripheral iridectomy was performed using the Vannas scissors and Chandler  forceps. Cautery was applied to the a edge of the incision 2 interrupted 10-0 nylon sutures were placed and one 10-0 nylon suture was placed as a releasable suture. The conjunctiva was then closed with a 90 Vicryls suture on a BV 100 needle after achieving watertight closure BSS was injected in the anterior chamber the chamber deep and the bleb elevated the incision was Seidel negative a subconjunctival injection of Kenalog 4 mg was given in the subconjunctival space inferior temporal. Topical atropine drops and topical Tobradex ointment was applied to the eye a patch and Fox U were placed and the patient returned to recovery area in stable condition [employer] M.D.

## 2015-02-17 NOTE — H&P (View-Only) (Signed)
                  History & Physical:   DATE:   02-01-15  NAME:  Marissa Green, Marissa Green     [id number]       HISTORY OF PRESENT ILLNESS: Chief Eye Complaints   Glaucoma  patient: Patient c/o having a fbs in OU at times. Va seems to be stable per pt. Patient c/o OS being red and swollen last month on the 13th, had to used the zylet.     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  ICD9:   Onset: 06/08/2014 11:01  Initial Date:   EIOP OD  Primary open-angle glaucoma, severe stage   ICD10: H40.11X3  ICD9:    UNCONTROLLED OD WITH PROGRESSIVE VISUAL FIELD LOSS DESPITE MAXIUM TOLERATED MEDICAL THERAPY.Initial Date:  SURGERIES:   trabeculectomy  with glaucoma device OS 12-03-13 suturelysis  x's 2 OS 4-9 & 12-05-2013 OS SLT OS (N)  08/07/2013  SLT OS temporal 180 degrees 11/07/2012 11:45  MEDICATIONS: Zylet: 0.5%-0.3% suspension SIG-  1 gtt OS BID for 2 weeks then 1 gtt OS for 2 week then stop    Lumigan: 0.01% solution SIG-  1 gtt in each affected eye once a day (in the evening) for 30 days  OD  Brimonidine Tartrate: 0.2% solution SIG-  1 drop OD tid    Timolol (Timoptic) Ophthalmic Solution:   0.5% solution  SIG-  1 drop(s)   once a day   Methazolamide1 pill  BID  REVIEW OF SYSTEMS: ROS:   GEN- Constitutional: normal PSYCH/Psychiatric:    Is the pt oriented to time, place, person? yes  Mood  normal   TOBACCO: Never smoker   ICD10: Z87.898 ICD9: V13.89   Pick List - Tobacco - Summary    Smoker Status:  SOCIAL HISTORY: Single.  [employer] [employer]   Starter Pick List - Social History  FAMILY HISTORY: Positive family history for  -  Glaucoma Both parents Glaucomca CHILDREN:Family History - 1st Degree Relatives:  Son alive and well. Daughter alive and well.    ALLERGIES: Diamox:  nausea &  emesis  PHYSICAL EXAMINATION: Exam: GENERAL: Appearance: General appearance can be described as well-nourished, well-developed, and in no acute distress.        VS: BMI: 21.7.  BP: 116/68.  H: 63.00 in.  P: 72 /min.  RR: 16 /min.  W: 122lbs 0oz.     VA OD: CC: 20/20- OS: CC: 20/20- PH 20/NI  EYEGLASSES:  OD:-0.50 sph    OS:- 0.50 + 0.25 x 032 ADD:+ 1.75  MR: 02/11/2014 16:16  OD: -1.00 +0.25 x173 20/20 OS: -1.50 +1.50 x119 20/25 ADD: +1.75  VF:   OD:dense nasal step                                            OS:double arcuate defect  Motility: orthophoria full  PUPILS: 4mm -MG  EYELIDS & OCULAR ADNEXA: normal OD , clogged gland OD upper lid   SLE: Conjunctiva:  OS elevated cystic bleb - leak  -siedel  Cornea: arcus  OU and  inferior staining OD   Decreased Tear Break-Up Time,   1+ staining OD    Anterior Chamber:  deep and quiet  OD,     tube in good position with trace cell & flair OS  OS  Iris:  PERIPHERAL  IRIDECTOMY   OU  Lens: +1  nuclear  sclerosis  OU  Ta   in mmHg     OD: 24 OS:  17,18 Time 02/01/2015 11:01   Gonio:02/01/2015 11:04  OD:Inferior PAS / Superior PAS TM visible nasal and temporal angle open 360 degrees to TM, superior angle very shallow, can only visualize anterior TM  OS: Angle open to TM 360 degrees, tube resting on iris    Dilation: not done EIOP  Fundus:  optic nerve:  OD:90 % cupping very thin nasal and temporal rim  OS:90 % cupping temporal pallor  thin nasal  Macula:  normal OU  Vessels: normal OU  Periphery: normal OU     Visual Fields   OD:  Double Bjerrum appears unchanged compared to last test  OS: Stable  PERG ABNORMAL OU  Exam: GENERAL: Appearance: HEAD, EARS, NOSE AND THROAT: Ears-Nose (external) Inspection: Externally, nose and ears are normal in appearance and without scars, lesions, or nodules.      Hearing assessment shows no problems with normal conversation.      LUNGS and RESPIRATORY: Lung auscultation  elicits no wheezing, rhonci, rales or rubs and with equal breath sounds.    Respiratory effort described as breathing is unlabored and chest movement is symmetrical.    HEART (Cardiovascular): Heart auscultation discovers regular rate and rhythm; no murmur, gallop or rub. Normal heart sounds.    ABDOMEN (Gastrointestinal): Mass/Tenderness Exam: Neither are present.     MUSCULOSKELETAL (BJE): Inspection-Palpation: No major bone, joint, tendon, or muscle changes.      NEUROLOGICAL: Alert and oriented. No major deficits of coordination or sensation.      PSYCHIATRIC: Insight and judgment appear  both to be intact and appropriate.    Mood and affect are described as normal mood and full affect.    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  SURGICAL TREATMENT PLAN: trabeculectomy  with  PERIPHERAL  IRIDECTOMY   with [facility] OD    Risk and benefits of surgery have been reviewed with the patient and the patient agrees to proceed with the surgical procedure. The possibility   of vision loss w EIOP discussed w  patient     ___________________________ Thalia Turkington, Jr.   

## 2015-02-17 NOTE — Anesthesia Procedure Notes (Signed)
Procedure Name: MAC Date/Time: 02/17/2015 12:05 PM Performed by: Glo Herring B Pre-anesthesia Checklist: Patient identified, Timeout performed, Emergency Drugs available, Suction available and Patient being monitored Patient Re-evaluated:Patient Re-evaluated prior to inductionOxygen Delivery Method: Nasal cannula Intubation Type: IV induction Placement Confirmation: positive ETCO2 and breath sounds checked- equal and bilateral Dental Injury: Teeth and Oropharynx as per pre-operative assessment

## 2015-02-17 NOTE — Anesthesia Preprocedure Evaluation (Addendum)
Anesthesia Evaluation  Patient identified by MRN, date of birth, ID band Patient awake    Reviewed: Allergy & Precautions, H&P , NPO status , Patient's Chart, lab work & pertinent test results  Airway Mallampati: II  TM Distance: >3 FB Neck ROM: Full    Dental no notable dental hx. (+) Dental Advisory Given, Teeth Intact   Pulmonary neg pulmonary ROS,  breath sounds clear to auscultation  Pulmonary exam normal       Cardiovascular negative cardio ROS  Rhythm:Regular Rate:Normal     Neuro/Psych negative neurological ROS  negative psych ROS   GI/Hepatic negative GI ROS, Neg liver ROS,   Endo/Other  Hyperthyroidism   Renal/GU negative Renal ROS  negative genitourinary   Musculoskeletal   Abdominal   Peds  Hematology negative hematology ROS (+)   Anesthesia Other Findings   Reproductive/Obstetrics negative OB ROS                           Anesthesia Physical Anesthesia Plan  ASA: II  Anesthesia Plan: MAC   Post-op Pain Management:    Induction: Intravenous  Airway Management Planned: Nasal Cannula  Additional Equipment:   Intra-op Plan:   Post-operative Plan:   Informed Consent: I have reviewed the patients History and Physical, chart, labs and discussed the procedure including the risks, benefits and alternatives for the proposed anesthesia with the patient or authorized representative who has indicated his/her understanding and acceptance.   Dental advisory given  Plan Discussed with: Anesthesiologist and Surgeon  Anesthesia Plan Comments:         Anesthesia Quick Evaluation

## 2015-02-17 NOTE — Discharge Instructions (Signed)
The patient should leave the eye patch on the eye until she is seen in the doctor's office tomorrow. Do not apply any pressure to the eye avoid heavy lifting bending or straining. The patient should sleep on her back or left side.

## 2015-02-18 ENCOUNTER — Encounter (HOSPITAL_COMMUNITY): Payer: Self-pay | Admitting: Ophthalmology

## 2019-06-02 ENCOUNTER — Other Ambulatory Visit: Payer: Self-pay

## 2019-06-02 ENCOUNTER — Emergency Department (HOSPITAL_BASED_OUTPATIENT_CLINIC_OR_DEPARTMENT_OTHER): Payer: Medicare Other

## 2019-06-02 ENCOUNTER — Encounter (HOSPITAL_BASED_OUTPATIENT_CLINIC_OR_DEPARTMENT_OTHER): Payer: Self-pay

## 2019-06-02 ENCOUNTER — Emergency Department (HOSPITAL_BASED_OUTPATIENT_CLINIC_OR_DEPARTMENT_OTHER)
Admission: EM | Admit: 2019-06-02 | Discharge: 2019-06-02 | Disposition: A | Discharge status: Home / Self Care | Payer: Medicare Other | Attending: Emergency Medicine | Admitting: Emergency Medicine

## 2019-06-02 DIAGNOSIS — Z79899 Other long term (current) drug therapy: Secondary | ICD-10-CM | POA: Diagnosis not present

## 2019-06-02 DIAGNOSIS — R0789 Other chest pain: Secondary | ICD-10-CM | POA: Diagnosis present

## 2019-06-02 DIAGNOSIS — E079 Disorder of thyroid, unspecified: Secondary | ICD-10-CM | POA: Insufficient documentation

## 2019-06-02 HISTORY — DX: Disorder of thyroid, unspecified: E07.9

## 2019-06-02 LAB — CBC WITH DIFFERENTIAL/PLATELET
Abs Immature Granulocytes: 0.01 10*3/uL (ref 0.00–0.07)
Basophils Absolute: 0 10*3/uL (ref 0.0–0.1)
Basophils Relative: 1 %
Eosinophils Absolute: 0.1 10*3/uL (ref 0.0–0.5)
Eosinophils Relative: 1 %
HCT: 39.6 % (ref 36.0–46.0)
Hemoglobin: 12.8 g/dL (ref 12.0–15.0)
Immature Granulocytes: 0 %
Lymphocytes Relative: 35 %
Lymphs Abs: 2.5 10*3/uL (ref 0.7–4.0)
MCH: 30 pg (ref 26.0–34.0)
MCHC: 32.3 g/dL (ref 30.0–36.0)
MCV: 93 fL (ref 80.0–100.0)
Monocytes Absolute: 0.7 10*3/uL (ref 0.1–1.0)
Monocytes Relative: 9 %
Neutro Abs: 3.9 10*3/uL (ref 1.7–7.7)
Neutrophils Relative %: 54 %
Platelets: 274 10*3/uL (ref 150–400)
RBC: 4.26 MIL/uL (ref 3.87–5.11)
RDW: 11.7 % (ref 11.5–15.5)
WBC: 7.2 10*3/uL (ref 4.0–10.5)
nRBC: 0 % (ref 0.0–0.2)

## 2019-06-02 LAB — BASIC METABOLIC PANEL
Anion gap: 10 (ref 5–15)
BUN: 13 mg/dL (ref 8–23)
CO2: 27 mmol/L (ref 22–32)
Calcium: 10.3 mg/dL (ref 8.9–10.3)
Chloride: 100 mmol/L (ref 98–111)
Creatinine, Ser: 0.82 mg/dL (ref 0.44–1.00)
GFR calc Af Amer: >60 mL/min (ref >60)
GFR calc non Af Amer: >60 mL/min (ref >60)
Glucose, Bld: 117 mg/dL — ABNORMAL HIGH (ref 70–99)
Potassium: 3.6 mmol/L (ref 3.5–5.1)
Sodium: 137 mmol/L (ref 135–145)

## 2019-06-02 LAB — TROPONIN I (HIGH SENSITIVITY): Troponin I (High Sensitivity): <2 ng/L (ref <18)

## 2019-06-02 NOTE — ED Triage Notes (Signed)
Pt states she has been feeling nervous and having intermittent chest pressure. Pt states it is worse upon waking. Pt denies having symptoms during her exercise.

## 2019-06-02 NOTE — ED Provider Notes (Signed)
[facility] EMERGENCY DEPARTMENT Provider Note   CSN: [id number] Arrival date & time: 06/02/19  2005     History   Chief Complaint Chief Complaint  Patient presents with  . Chest Pain    HPI Marissa Green is a 68 y.o. female.      Chest Pain Pain location:  Unable to specify Pain quality: shooting   Pain radiates to:  Does not radiate Pain severity:  Mild Onset quality:  Gradual Timing:  Intermittent Progression:  Resolved Chronicity:  New Context: at rest   Relieved by:  Nothing Worsened by:  Nothing Associated symptoms: no abdominal pain, no altered mental status, no anorexia, no anxiety, no back pain, no claudication, no cough, no fever, no palpitations, no shortness of breath and no vomiting   Risk factors: no coronary artery disease, no diabetes mellitus, no high cholesterol, no hypertension and no prior DVT/PE     Past Medical History:  Diagnosis Date  . Anemia    with pregnancy  . Glaucoma   . Thyroid disease     There are no active problems to display for this patient.   Past Surgical History:  Procedure Laterality Date  . COLONOSCOPY    . MINI SHUNT INSERTION Left 12/03/2013   Procedure: INSERTION OF MINI SHUNT LEFT EYE;  Surgeon: Chalmers Guestoy Whitaker, MD;  [facility];  Service: Ophthalmology;  Laterality: Left;  . MITOMYCIN C APPLICATION Right 02/17/2015   Procedure: MITOMYCIN C APPLICATION RIGHT EYE;  Surgeon: Chalmers Guestoy Whitaker, MD;  [facility];  Service: Ophthalmology;  Laterality: Right;  . TRABECULECTOMY Right 02/17/2015   Procedure: TRABECULECTOMY RIGHT EYE;  Surgeon: Chalmers Guestoy Whitaker, MD;  [facility];  Service: Ophthalmology;  Laterality: Right;     OB History   No obstetric history on file.      Home Medications    Prior to Admission medications   Medication Sig Start Date End Date Taking? Authorizing Provider  Multiple Vitamins-Minerals (MULTIVITAMIN WITH MINERALS) tablet Take 1 tablet by mouth daily.    [provider]  Vitamin D, Ergocalciferol, (DRISDOL) 50000 UNITS CAPS capsule Take 50,000 Units by mouth every 7 (seven) days. monday    [provider]    Family History Family History  Problem Relation Age of Onset  . Glaucoma Mother   . Heart murmur Mother   . Glaucoma Father     Social History Social History   Tobacco Use  . Smoking status: Never Smoker  . Smokeless tobacco: Never Used  Substance Use Topics  . Alcohol use: No  . Drug use: No     Allergies   Patient has no known allergies.   Review of Systems Review of Systems  Constitutional: Negative for chills and fever.  HENT: Negative for ear pain and sore throat.   Eyes: Negative for pain and visual disturbance.  Respiratory: Negative for cough and shortness of breath.   Cardiovascular: Positive for chest pain. Negative for palpitations and claudication.  Gastrointestinal: Negative for abdominal pain, anorexia and vomiting.  Genitourinary: Negative for dysuria and hematuria.  Musculoskeletal: Negative for arthralgias and back pain.  Skin: Negative for color change and rash.  Neurological: Negative for seizures and syncope.  Psychiatric/Behavioral: The patient is nervous/anxious.   All other systems reviewed and are negative.    Physical Exam Updated Vital Signs BP (!) 158/77 (BP Location: Right Arm)   Pulse 89   Temp 98.7 F (37.1 C) (Oral)   Resp 16   Ht 5\' 3"  (  1.6 m)   Wt 64.9 kg   SpO2 100%   BMI 25.33 kg/m   Physical Exam Vitals signs and nursing note reviewed.  Constitutional:      General: She is not in acute distress.    Appearance: She is well-developed. She is not ill-appearing.  HENT:     Head: Normocephalic and atraumatic.  Eyes:     Extraocular Movements: Extraocular movements intact.     Conjunctiva/sclera: Conjunctivae normal.     Pupils: Pupils are equal, round, and reactive to light.  Neck:     Musculoskeletal: Normal range of motion and neck supple.  Cardiovascular:      Rate and Rhythm: Normal rate and regular rhythm.     Pulses:          Radial pulses are 2+ on the right side and 2+ on the left side.       Dorsalis pedis pulses are 2+ on the right side and 2+ on the left side.     Heart sounds: Normal heart sounds. No murmur.  Pulmonary:     Effort: Pulmonary effort is normal. No respiratory distress.     Breath sounds: Normal breath sounds. No decreased breath sounds, wheezing, rhonchi or rales.  Abdominal:     Palpations: Abdomen is soft.     Tenderness: There is no abdominal tenderness.  Musculoskeletal: Normal range of motion.     Right lower leg: No edema.     Left lower leg: No edema.  Skin:    General: Skin is warm and dry.     Capillary Refill: Capillary refill takes less than 2 seconds.  Neurological:     General: No focal deficit present.     Mental Status: She is alert.     Cranial Nerves: No cranial nerve deficit.     Motor: No weakness.      ED Treatments / Results  Labs (all labs ordered are listed, but only abnormal results are displayed) Labs Reviewed  BASIC METABOLIC PANEL - Abnormal; Notable for the following components:      Result Value   Glucose, Bld 117 (*)    All other components within normal limits  CBC WITH DIFFERENTIAL/PLATELET  TROPONIN I (HIGH SENSITIVITY)    EKG EKG Interpretation  Date/Time:  Monday June 02 2019 20:15:26 EDT Ventricular Rate:  90 PR Interval:  128 QRS Duration: 78 QT Interval:  362 QTC Calculation: 442 R Axis:   18 Text Interpretation:  Normal sinus rhythm Possible Left atrial enlargement Borderline ECG Confirmed by Lennice Sites [phone]) on 06/02/2019 8:21:37 PM   Radiology Dg Chest Portable 1 View  Result Date: 06/02/2019 CLINICAL DATA:  Chest pain EXAM: PORTABLE CHEST 1 VIEW COMPARISON:  02/25/2011 FINDINGS: The heart size and mediastinal contours are within normal limits. Both lungs are clear. The visualized skeletal structures are unremarkable. IMPRESSION: No active  disease. Electronically Signed   By: Franchot Gallo M.D.   On: 06/02/2019 20:58    Procedures Procedures (including critical care time)  Medications Ordered in ED Medications - No data to display   Initial Impression / Assessment and Plan / ED Course  I have reviewed the triage vital signs and the nursing notes.  Pertinent labs & imaging results that were available during my care of the patient were reviewed by me and considered in my medical decision making (see chart for details).     Marissa Green is a 68 year old female with no significant medical history who presents to the ED  with nonspecific chest pain.  Patient with normal vitals.  No fever.  Patient states on and off for the last several days that she has had a tingling in her chest that is worse at night.  Has not had any active pain today.  EKG shows sinus rhythm.  No ischemic changes.  Patient denies any chest pain, shortness of breath, abdominal pain.  Overall does not have any symptoms upon my evaluation.  No cardiac risk factors.  Overall history and physical is not consistent with ACS.  Doubt PE.  Wells criteria 0.  Does not have any infectious symptoms.  Patient states that she is felt a lot of anxiety and thinks that she is mostly having anxiety/palpitations.  Will obtain lab work including troponin and reevaluate.  No significant anemia, electrolyte abnormality, kidney injury.  Troponin normal.  Chest x-ray without any signs of pneumonia, pneumothorax, pleural effusion.  Overall patient with atypical story for ACS.  Likely more anxiety/palpitations.  Does not have any current chest pain.  Has not had any for 24 hours.  No need for delta troponin.  Given reassurance and recommend follow-up with primary care doctor.  Discharged in good condition.  Given return precautions.  This chart was dictated using voice recognition software.  Despite best efforts to proofread,  errors can occur which can change the documentation meaning.     Final Clinical Impressions(s) / ED Diagnoses   Final diagnoses:  Atypical chest pain    ED Discharge Orders    None       Virgina Norfolk, DO 06/02/19 2230

## 2019-08-16 ENCOUNTER — Emergency Department (INDEPENDENT_AMBULATORY_CARE_PROVIDER_SITE_OTHER): Payer: Medicare Other

## 2019-08-16 ENCOUNTER — Other Ambulatory Visit: Payer: Self-pay

## 2019-08-16 ENCOUNTER — Emergency Department (INDEPENDENT_AMBULATORY_CARE_PROVIDER_SITE_OTHER)
Admission: EM | Admit: 2019-08-16 | Discharge: 2019-08-16 | Disposition: A | Discharge status: Home / Self Care | Payer: Medicare Other | Source: Home / Self Care | Attending: Family Medicine | Admitting: Family Medicine

## 2019-08-16 ENCOUNTER — Encounter: Payer: Self-pay | Admitting: Emergency Medicine

## 2019-08-16 DIAGNOSIS — R0789 Other chest pain: Secondary | ICD-10-CM | POA: Diagnosis not present

## 2019-08-16 DIAGNOSIS — M940 Chondrocostal junction syndrome [Tietze]: Secondary | ICD-10-CM | POA: Diagnosis not present

## 2019-08-16 NOTE — ED Provider Notes (Signed)
[facility]    CSN: [id number] Arrival date & time: 08/16/19  1343      History   Chief Complaint Chief Complaint  Patient presents with  . Chest Pain    HPI Marissa Green is a 68 y.o. female.   Patient states that she was putting up her Christmas tree last week and felt something pull in her anterior chest.  She has had persistent anterior chest discomfort with any movement.  She denies shortness of breath.  The history is provided by the patient.  Chest Pain Pain location:  Substernal area Pain quality: sharp   Pain quality: not radiating   Pain radiates to:  Does not radiate Pain severity:  Mild Onset quality:  Sudden Duration:  2 weeks Timing:  Sporadic Progression:  Unchanged Chronicity:  New Context: breathing, lifting, movement and at rest   Relieved by:  None tried Worsened by:  Movement and certain positions Ineffective treatments:  None tried Associated symptoms: no abdominal pain, no back pain, no cough, no diaphoresis, no fatigue, no fever, no lower extremity edema, no nausea, no orthopnea, no palpitations and no shortness of breath     Past Medical History:  Diagnosis Date  . Anemia    with pregnancy  . Glaucoma   . Thyroid disease     There are no problems to display for this patient.   Past Surgical History:  Procedure Laterality Date  . COLONOSCOPY    . MINI SHUNT INSERTION Left 12/03/2013   Procedure: INSERTION OF MINI SHUNT LEFT EYE;  Surgeon: Marylynn Pearson, MD;  Location: [place];  Service: Ophthalmology;  Laterality: Left;  . MITOMYCIN C APPLICATION Right 01/23/4131   Procedure: MITOMYCIN C APPLICATION RIGHT EYE;  Surgeon: Marylynn Pearson, MD;  Location: [place];  Service: Ophthalmology;  Laterality: Right;  . TRABECULECTOMY Right 02/17/2015   Procedure: TRABECULECTOMY RIGHT EYE;  Surgeon: Marylynn Pearson, MD;  Location: [place];  Service: Ophthalmology;  Laterality: Right;    OB History   No obstetric history on file.      Home  Medications    Prior to Admission medications   Medication Sig Start Date End Date Taking? Authorizing Provider  Multiple Vitamins-Minerals (MULTIVITAMIN WITH MINERALS) tablet Take 1 tablet by mouth daily.    [provider]  Vitamin D, Ergocalciferol, (DRISDOL) 50000 UNITS CAPS capsule Take 50,000 Units by mouth every 7 (seven) days. monday    [provider]    Family History Family History  Problem Relation Age of Onset  . Glaucoma Mother   . Heart murmur Mother   . Glaucoma Father     Social History Social History   Tobacco Use  . Smoking status: Never Smoker  . Smokeless tobacco: Never Used  Substance Use Topics  . Alcohol use: No  . Drug use: No     Allergies   Patient has no known allergies.   Review of Systems Review of Systems  Constitutional: Negative for chills, diaphoresis, fatigue and fever.  Respiratory: Negative for cough, chest tightness, shortness of breath, wheezing and stridor.   Cardiovascular: Positive for chest pain. Negative for palpitations, orthopnea and leg swelling.  Gastrointestinal: Negative for abdominal pain and nausea.  Musculoskeletal: Negative for back pain.  All other systems reviewed and are negative.    Physical Exam Triage Vital Signs ED Triage Vitals  Enc Vitals Group     BP 08/16/19 1545 (!) 169/78     Pulse Rate 08/16/19 1545 98  Resp --      Temp 08/16/19 1545 98.1 F (36.7 C)     Temp Source 08/16/19 1545 Oral     SpO2 08/16/19 1545 100 %     Weight 08/16/19 1543 145 lb (65.8 kg)     Height --      Head Circumference --      Peak Flow --      Pain Score 08/16/19 1542 6     Pain Loc --      Pain Edu? --      Excl. in GC? --    No data found.  Updated Vital Signs BP (!) 169/78 (BP Location: Right Arm)   Pulse 98   Temp 98.1 F (36.7 C) (Oral)   Wt 65.8 kg   SpO2 100%   BMI 25.69 kg/m   Visual Acuity Right Eye Distance:   Left Eye Distance:   Bilateral Distance:    Right Eye  Near:   Left Eye Near:    Bilateral Near:     Physical Exam Nursing notes and Vital Signs reviewed. Appearance:  Patient appears stated age, and in no acute distress.    Eyes:  Pupils are equal, round, and reactive to light and accomodation.  Extraocular movement is intact.  Conjunctivae are not inflamed   Pharynx:  Normal; moist mucous membranes  Neck:  Supple.  No adenopathy Lungs:  Clear to auscultation.  Breath sounds are equal.  Moving air well. Chest:  Distinct tenderness to palpation over the mid-sternum.  Heart:  Regular rate and rhythm without murmurs, rubs, or gallops.  Abdomen:  Nontender without masses or hepatosplenomegaly.  Bowel sounds are present.  No CVA or flank tenderness.  Extremities:  No edema.  Skin:  No rash present.     UC Treatments / Results  Labs (all labs ordered are listed, but only abnormal results are displayed) Labs Reviewed - No data to display  EKG   Radiology DG Sternum  Result Date: 08/16/2019 CLINICAL DATA:  Acute chest and sternal pain for 1 week. EXAM: STERNUM - 2+ VIEW COMPARISON:  None. FINDINGS: There is no evidence of fracture or other focal bone lesions. IMPRESSION: Negative. Electronically Signed   By: Harmon Pier M.D.   On: 08/16/2019 16:09    Procedures Procedures (including critical care time)  Medications Ordered in UC Medications - No data to display  Initial Impression / Assessment and Plan / UC Course  I have reviewed the triage vital signs and the nursing notes.  Pertinent labs & imaging results that were available during my care of the patient were reviewed by me and considered in my medical decision making (see chart for details).    Followup with Family Doctor if not improved in about 2 weeks.   Final Clinical Impressions(s) / UC Diagnoses   Final diagnoses:  Costochondritis     Discharge Instructions     Apply ice pack for 20 to 30 minutes, 3 to 4 times daily  Continue until pain and swelling decrease.   May take Ibuprofen 200mg , 3 or 4 tabs every 8 hours with food for 3 to 5 days.  May take Tylenol as needed for pain.    ED Prescriptions    None        , MD 08/25/19 [phone]

## 2019-08-16 NOTE — Discharge Instructions (Addendum)
Apply ice pack for 20 to 30 minutes, 3 to 4 times daily  Continue until pain and swelling decrease.  May take Ibuprofen 200mg , 3 or 4 tabs every 8 hours with food for 3 to 5 days.  May take Tylenol as needed for pain.

## 2019-08-16 NOTE — ED Triage Notes (Signed)
Pt states she was putting up her christmas tree last week and felt something pull. Feels like she pulled a muscle in her chest.

## 2019-10-06 ENCOUNTER — Ambulatory Visit: Payer: Medicare PPO

## 2019-12-12 ENCOUNTER — Emergency Department (INDEPENDENT_AMBULATORY_CARE_PROVIDER_SITE_OTHER)
Admission: EM | Admit: 2019-12-12 | Discharge: 2019-12-12 | Disposition: A | Discharge status: Home / Self Care | Payer: Medicare PPO | Source: Home / Self Care

## 2019-12-12 DIAGNOSIS — R0789 Other chest pain: Secondary | ICD-10-CM | POA: Diagnosis not present

## 2019-12-12 MED ORDER — MELOXICAM 7.5 MG PO TABS: 7.5000 mg | ORAL_TABLET | Freq: Every day | ORAL | 0 refills | Status: DC

## 2019-12-12 NOTE — ED Triage Notes (Signed)
Pt c/o chest pain since Tues. Pt does mention she was lifting heavy blocks to put around her garden. Has tried ice and motrin prn. Pain 3/10 No personal hx of heart problems.

## 2019-12-12 NOTE — ED Provider Notes (Signed)
[facility]    CSN: [id number] Arrival date & time: 12/12/19  0801      History   Chief Complaint Chief Complaint  Patient presents with  . Chest Pain    HPI Marissa Green is a 69 y.o. female.   HPI Marissa Green is a 69 y.o. female presenting to UC with c/o anterior chest pain that started 3 days ago after lifting about 12 cinder blocks to put around her garden the other day.  Pain is pretty constant, aching and sore, worse with certain movements.  She has tried ice and motrin as needed, which provides temporary relief. Hx of similar pain a few years ago.    Pain is 3/10. Denies hx of heart problems. No SOB. No radiation of pain. No diaphoresis or nausea with the pain.     Past Medical History:  Diagnosis Date  . Anemia    with pregnancy  . Glaucoma   . Thyroid disease     There are no problems to display for this patient.   Past Surgical History:  Procedure Laterality Date  . COLONOSCOPY    . MINI SHUNT INSERTION Left 12/03/2013   Procedure: INSERTION OF MINI SHUNT LEFT EYE;  Surgeon: Chalmers Guest, MD;  Location: [facility] OR;  Service: Ophthalmology;  Laterality: Left;  . MITOMYCIN C APPLICATION Right 02/17/2015   Procedure: MITOMYCIN C APPLICATION RIGHT EYE;  Surgeon: Chalmers Guest, MD;  Location: [facility];  Service: Ophthalmology;  Laterality: Right;  . TRABECULECTOMY Right 02/17/2015   Procedure: TRABECULECTOMY RIGHT EYE;  Surgeon: Chalmers Guest, MD;  Location: [facility] OR;  Service: Ophthalmology;  Laterality: Right;    OB History   No obstetric history on file.      Home Medications    Prior to Admission medications   Medication Sig Start Date End Date Taking? Authorizing Provider  methimazole (TAPAZOLE) 5 MG tablet TAKE 1/2 (ONE-HALF) TABLET BY MOUTH ONCE DAILY 05/27/19  Yes [provider]  meloxicam (MOBIC) 7.5 MG tablet Take 1-2 tablets (7.5-15 mg total) by mouth daily. 12/12/19   Lurene Shadow, PA-C  Multiple Vitamins-Minerals (MULTIVITAMIN WITH  MINERALS) tablet Take 1 tablet by mouth daily.    [provider]  Vitamin D, Ergocalciferol, (DRISDOL) 50000 UNITS CAPS capsule Take 50,000 Units by mouth every 7 (seven) days. monday    [provider]    Family History Family History  Problem Relation Age of Onset  . Glaucoma Mother   . Heart murmur Mother   . Glaucoma Father     Social History Social History   Tobacco Use  . Smoking status: Never Smoker  . Smokeless tobacco: Never Used  Substance Use Topics  . Alcohol use: No  . Drug use: No     Allergies   Patient has no known allergies.   Review of Systems Review of Systems  Constitutional: Negative for chills, diaphoresis, fatigue and fever.  Cardiovascular: Positive for chest pain. Negative for palpitations and leg swelling.  Gastrointestinal: Negative for nausea and vomiting.  Musculoskeletal: Negative for arthralgias, back pain, myalgias and neck pain.  Skin: Negative for color change and rash.  Neurological: Negative for dizziness, light-headedness and headaches.     Physical Exam Triage Vital Signs ED Triage Vitals  Enc Vitals Group     BP 12/12/19 0813 (!) 150/76     Pulse Rate 12/12/19 0813 77     Resp --      Temp 12/12/19 0813 (!) 97.5 F (36.4 C)  Temp Source 12/12/19 0813 Oral     SpO2 12/12/19 0813 99 %     Weight 12/12/19 0816 140 lb (63.5 kg)     Height 12/12/19 0816 5\' 3"  (1.6 m)     Head Circumference --      Peak Flow --      Pain Score 12/12/19 0817 3     Pain Loc --      Pain Edu? --      Excl. in GC? --    No data found.  Updated Vital Signs BP (!) 150/76 (BP Location: Left Arm)   Pulse 77   Temp (!) 97.5 F (36.4 C) (Oral)   Ht 5\' 3"  (1.6 m)   Wt 140 lb (63.5 kg)   SpO2 99%   BMI 24.80 kg/m   Visual Acuity Right Eye Distance:   Left Eye Distance:   Bilateral Distance:    Right Eye Near:   Left Eye Near:    Bilateral Near:     Physical Exam Vitals and nursing note reviewed.    Constitutional:      General: She is not in acute distress.    Appearance: She is well-developed. She is not ill-appearing, toxic-appearing or diaphoretic.  HENT:     Head: Normocephalic and atraumatic.  Cardiovascular:     Rate and Rhythm: Normal rate and regular rhythm.  Pulmonary:     Effort: Pulmonary effort is normal.     Breath sounds: No decreased breath sounds, wheezing, rhonchi or rales.  Chest:    Musculoskeletal:        General: Normal range of motion.     Cervical back: Normal range of motion.  Skin:    General: Skin is warm and dry.     Findings: No ecchymosis or erythema.  Neurological:     Mental Status: She is alert and oriented to person, place, and time.  Psychiatric:        Behavior: Behavior normal.      UC Treatments / Results  Labs (all labs ordered are listed, but only abnormal results are displayed) Labs Reviewed - No data to display  EKG Date/Time:12/12/2019    08:27:31 Ventricular Rate: 69 PR Interval: 134 QRS Duration: 96 QT Interval: 388 QTC Calculation: 415 P-R-T axes: 74   -2   19 Text Interpretation: Normal sinus rhythm, Possible Left atrial enlargement.  Borderline ECG    Radiology No results found.  Procedures Procedures (including critical care time)  Medications Ordered in UC Medications - No data to display  Initial Impression / Assessment and Plan / UC Course  I have reviewed the triage vital signs and the nursing notes.  Pertinent labs & imaging results that were available during my care of the patient were reviewed by me and considered in my medical decision making (see chart for details).     CP is atypical for AVS EKG performed in triage.  Reassured pt of unremarkable EKG  Hx and exam c/w musculoskeletal pain from recent heavy lifting Encouraged symptomatic tx  AVS provided  Final Clinical Impressions(s) / UC Diagnoses   Final diagnoses:  Chest wall pain     Discharge Instructions       Meloxicam (Mobic) is an antiinflammatory to help with pain and inflammation.  Do not take ibuprofen, Advil, Aleve, or any other medications that contain NSAIDs while taking meloxicam as this may cause stomach upset or even ulcers if taken in large amounts for an extended period of time.  You may take Tylenol with the Meloxicam and you can also alternate cool and warm compresses to help with pain.   Please follow up with family medicine later next week if not improving.    ED Prescriptions    Medication Sig Dispense Auth. Provider   meloxicam (MOBIC) 7.5 MG tablet Take 1-2 tablets (7.5-15 mg total) by mouth daily. 14 tablet Noe Gens, [place]     PDMP not reviewed this encounter.   Noe Gens, [place] 12/12/19 [phone]

## 2019-12-12 NOTE — Discharge Instructions (Signed)
  Meloxicam (Mobic) is an antiinflammatory to help with pain and inflammation.  Do not take ibuprofen, Advil, Aleve, or any other medications that contain NSAIDs while taking meloxicam as this may cause stomach upset or even ulcers if taken in large amounts for an extended period of time.   You may take Tylenol with the Meloxicam and you can also alternate cool and warm compresses to help with pain.   Please follow up with family medicine later next week if not improving.

## 2019-12-20 ENCOUNTER — Emergency Department (INDEPENDENT_AMBULATORY_CARE_PROVIDER_SITE_OTHER)
Admission: EM | Admit: 2019-12-20 | Discharge: 2019-12-20 | Disposition: A | Discharge status: Home / Self Care | Payer: Medicare PPO | Source: Home / Self Care | Attending: Family Medicine | Admitting: Family Medicine

## 2019-12-20 ENCOUNTER — Emergency Department (INDEPENDENT_AMBULATORY_CARE_PROVIDER_SITE_OTHER): Payer: Medicare PPO

## 2019-12-20 ENCOUNTER — Other Ambulatory Visit: Payer: Self-pay

## 2019-12-20 ENCOUNTER — Encounter: Payer: Self-pay | Admitting: Emergency Medicine

## 2019-12-20 DIAGNOSIS — M5412 Radiculopathy, cervical region: Secondary | ICD-10-CM

## 2019-12-20 MED ORDER — HYDROCODONE-ACETAMINOPHEN 5-325 MG PO TABS: ORAL_TABLET | ORAL | 0 refills | Status: DC

## 2019-12-20 MED ORDER — PREDNISONE 20 MG PO TABS: ORAL_TABLET | ORAL | 0 refills | Status: DC

## 2019-12-20 NOTE — ED Provider Notes (Signed)
[facility]    CSN: [id number] Arrival date & time: 12/20/19  0909      History   Chief Complaint Chief Complaint  Patient presents with  . Arm Pain  . Chest Pain    HPI Marissa Green is a 69 y.o. female.   Patient complains of continuing pain in her upper chest, left neck, left shoulder, and left upper back.  The pain had improved until she lifted some boards while working on a home carpentry project.  She denies shortness of breath or pleuritic pain.  The history is provided by the patient.    Past Medical History:  Diagnosis Date  . Anemia    with pregnancy  . Glaucoma   . Thyroid disease     There are no problems to display for this patient.   Past Surgical History:  Procedure Laterality Date  . COLONOSCOPY    . MINI SHUNT INSERTION Left 12/03/2013   Procedure: INSERTION OF MINI SHUNT LEFT EYE;  Surgeon: Chalmers Guest, MD;  Location: [facility] OR;  Service: Ophthalmology;  Laterality: Left;  . MITOMYCIN C APPLICATION Right 02/17/2015   Procedure: MITOMYCIN C APPLICATION RIGHT EYE;  Surgeon: Chalmers Guest, MD;  Location: [facility] OR;  Service: Ophthalmology;  Laterality: Right;  . TRABECULECTOMY Right 02/17/2015   Procedure: TRABECULECTOMY RIGHT EYE;  Surgeon: Chalmers Guest, MD;  Location: [facility] OR;  Service: Ophthalmology;  Laterality: Right;    OB History   No obstetric history on file.      Home Medications    Prior to Admission medications   Medication Sig Start Date End Date Taking? Authorizing Provider  HYDROcodone-acetaminophen (NORCO/VICODIN) 5-325 MG tablet Take one by mouth at bedtime as needed for pain 12/20/19   Lattie Haw, MD  meloxicam (MOBIC) 7.5 MG tablet Take 1-2 tablets (7.5-15 mg total) by mouth daily. 12/12/19   Lurene Shadow, PA-C  methimazole (TAPAZOLE) 5 MG tablet TAKE 1/2 (ONE-HALF) TABLET BY MOUTH ONCE DAILY 05/27/19   [provider]  Multiple Vitamins-Minerals (MULTIVITAMIN WITH MINERALS) tablet Take 1 tablet by mouth daily.     [provider]  predniSONE (DELTASONE) 20 MG tablet Take one tab by mouth twice daily for 4 days, then one daily for 3 days. Take with food. 12/20/19   Lattie Haw, MD  Vitamin D, Ergocalciferol, (DRISDOL) 50000 UNITS CAPS capsule Take 50,000 Units by mouth every 7 (seven) days. monday    [provider]    Family History Family History  Problem Relation Age of Onset  . Glaucoma Mother   . Heart murmur Mother   . Glaucoma Father     Social History Social History   Tobacco Use  . Smoking status: Never Smoker  . Smokeless tobacco: Never Used  Substance Use Topics  . Alcohol use: No  . Drug use: No     Allergies   Patient has no known allergies.   Review of Systems Review of Systems  Constitutional: Negative.   HENT: Negative.   Eyes: Negative.   Respiratory: Negative for cough, chest tightness, shortness of breath, wheezing and stridor.   Cardiovascular: Negative.   Gastrointestinal: Negative.   Genitourinary: Negative.   Musculoskeletal: Positive for neck pain.       Left shoulder pain  Skin: Negative for rash.  Neurological: Negative for headaches.  All other systems reviewed and are negative.    Physical Exam Triage Vital Signs ED Triage Vitals  Enc Vitals Group     BP 12/20/19 0927 Marland Kitchen)  150/85     Pulse Rate 12/20/19 0927 83     Resp 12/20/19 0927 16     Temp 12/20/19 0927 98 F (36.7 C)     Temp Source 12/20/19 0927 Oral     SpO2 12/20/19 0927 99 %     Weight 12/20/19 0930 138 lb 14.2 oz (63 kg)     Height 12/20/19 0930 5\' 3"  (1.6 m)     Head Circumference --      Peak Flow --      Pain Score 12/20/19 0929 7     Pain Loc --      Pain Edu? --      Excl. in [place]? --    No data found.  Updated Vital Signs BP (!) 150/85 (BP Location: Right Arm)   Pulse 83   Temp 98 F (36.7 C) (Oral)   Resp 16   Ht 5\' 3"  (1.6 m)   Wt 63 kg   SpO2 99%   BMI 24.60 kg/m   Visual Acuity Right Eye Distance:   Left Eye Distance:     Bilateral Distance:    Right Eye Near:   Left Eye Near:    Bilateral Near:     Physical Exam Vitals and nursing note reviewed.  Constitutional:      General: She is not in acute distress. HENT:     Head: Normocephalic.     Mouth/Throat:     Pharynx: Oropharynx is clear.  Eyes:     Pupils: Pupils are equal, round, and reactive to light.  Neck:     Comments: Decreased range of motion  Cardiovascular:     Rate and Rhythm: Normal rate.     Heart sounds: Normal heart sounds.  Pulmonary:     Breath sounds: Normal breath sounds.  Abdominal:     Palpations: Abdomen is soft.     Tenderness: There is no abdominal tenderness.  Musculoskeletal:        General: No tenderness.     Right lower leg: No edema.     Left lower leg: No edema.  Skin:    General: Skin is warm and dry.     Findings: No rash.          Comments: Patient has pain in left neck, left scapular area, and left superior anterior chest in distribution as noted on diagram, but there is no tenderness to palpation in these areas.  Extension/flexion of her neck, and left lateral flexion of her neck increases her pain, which she describes as a dull tingling ache.   Neurological:     Mental Status: She is alert and oriented to person, place, and time.      UC Treatments / Results  Labs (all labs ordered are listed, but only abnormal results are displayed) Labs Reviewed - No data to display  EKG   Radiology DG Cervical Spine Complete  Result Date: 12/20/2019 CLINICAL DATA:  Pain. EXAM: CERVICAL SPINE - COMPLETE 4+ VIEW COMPARISON:  None. FINDINGS: There is soft tissue calcification posterior to the C7 spinous process. No other fractures or traumatic malalignment. No other acute abnormalities. IMPRESSION: A soft tissue calcification posterior to the C7 spinous process may be nonacute is likely sequela of previous trauma. No other acute abnormalities. Electronically Signed   By: Dorise Bullion III M.D   On:  12/20/2019 11:27    Procedures Procedures (including critical care time)  Medications Ordered in UC Medications - No data to display  Initial  Impression / Assessment and Plan / UC Course  I have reviewed the triage vital signs and the nursing notes.  Pertinent labs & imaging results that were available during my care of the patient were reviewed by me and considered in my medical decision making (see chart for details).    Negative C-spine films reassuring.  Begin prednisone burst/taper. Rx for Vicodin at bedtime prn (#5, no refill). Controlled Substance Prescriptions I have consulted the [place] Controlled Substances Registry for this patient, and feel the risk/benefit ratio today is favorable for proceeding with this prescription for a controlled substance.   Followup with Dr. Rodney [facility]ton ([facility]) for further evaluation/treatment.   Final Clinical Impressions(s) / UC Diagnoses   Final diagnoses:  Left cervical radiculopathy     Discharge Instructions     Discontinue meloxicam for now (may resume taking after finishing prednisone).  May take Tylenol during the day for pain. Apply ice pack to back of neck for 30 minutes, 2 to 3 times daily.    ED Prescriptions    Medication Sig Dispense Auth. Provider   predniSONE (DELTASONE) 20 MG tablet Take one tab by mouth twice daily for 4 days, then one daily for 3 days. Take with food. 11 tablet Lattie Haw, MD   HYDROcodone-acetaminophen (NORCO/VICODIN) 5-325 MG tablet Take one by mouth at bedtime as needed for pain 5 tablet Lattie Haw, MD        Lattie Haw, MD 12/22/19 [id number]

## 2019-12-20 NOTE — ED Triage Notes (Signed)
Patient here for ongoing pain across upper chest with area of edema between midline clavicle junction; states also soreness in left arm which she thinks might be due to carpentry project at home yesterday (uses her left hand predominantly); took her meloxicam as ordered today; denies shortness of breath or any other cardiac symptoms. No known exposure covid positive person.

## 2019-12-20 NOTE — Discharge Instructions (Addendum)
Discontinue meloxicam for now (may resume taking after finishing prednisone).  May take Tylenol during the day for pain. Apply ice pack to back of neck for 30 minutes, 2 to 3 times daily.

## 2020-01-14 ENCOUNTER — Other Ambulatory Visit: Payer: Self-pay

## 2020-01-14 ENCOUNTER — Emergency Department (INDEPENDENT_AMBULATORY_CARE_PROVIDER_SITE_OTHER): Payer: Medicare PPO

## 2020-01-14 ENCOUNTER — Emergency Department (INDEPENDENT_AMBULATORY_CARE_PROVIDER_SITE_OTHER)
Admission: EM | Admit: 2020-01-14 | Discharge: 2020-01-14 | Disposition: A | Discharge status: Home / Self Care | Payer: Medicare PPO | Source: Home / Self Care | Attending: Emergency Medicine | Admitting: Emergency Medicine

## 2020-01-14 DIAGNOSIS — M25551 Pain in right hip: Secondary | ICD-10-CM

## 2020-01-14 DIAGNOSIS — M5431 Sciatica, right side: Secondary | ICD-10-CM

## 2020-01-14 DIAGNOSIS — M79651 Pain in right thigh: Secondary | ICD-10-CM | POA: Diagnosis not present

## 2020-01-14 MED ORDER — PREDNISONE 10 MG (21) PO TBPK: ORAL_TABLET | ORAL | 0 refills | Status: DC

## 2020-01-14 NOTE — Discharge Instructions (Addendum)
Today, x-ray right hip shows some arthritis of right hip but no fracture or dislocation. You likely have right hip and thigh pain from inflammation of nerve endings. You said that you stopped the gabapentin (which was prescribed by your neurologist) after 3 days .  You need to get back on taking the gabapentin, 1 every night as prescribed and follow-up or phone your neurologist within 1 week.-Do not stop taking medicines unless you have consulted with your physician about this. I am also sending prescription to your pharmacy for 6 days of prednisone Dosepak. I know your physical therapy appointment is pending and hopefully that appointment can be made soon by your neurologist. We will send copy of today's notes to your PCP and your neurologist.

## 2020-01-14 NOTE — ED Triage Notes (Signed)
Patient presents to Urgent Care with complaints of right posterior leg pain that radiates from her hip down the back of her leg since about 5 weeks ago. Patient reports she went to the doctor about 3 weeks ago and they gave her a shot in her hip and that helped but then it got bad again.

## 2020-01-14 NOTE — ED Provider Notes (Signed)
[facility]    CSN: [id number] Arrival date & time: 01/14/20  1603      History   Chief Complaint Chief Complaint  Patient presents with  . Leg Pain    HPI Marissa Green is a 69 y.o. female.   HPI Patient presents to [facility] with complaints of right hip and right posterior leg pain that radiates down lateral aspect right upper leg( from her hip down the back of her right upper lateral leg) since about 5 weeks ago.  Today, she is politely insistent that I order x-ray right hip today. Patient reports she went to the doctor about 3 weeks ago and they gave her a shot in her hip and that helped but then it got bad again. -She reports that she has been to many doctors the past couple of months for musculoskeletal problems.  She recalls no 1 specific injury. Old records show that she is complained of neck and back pain for about 3 years. Her PCP is Cruz Condon, phone [phone]  I have reviewed medical records/details of recent medical visits. Seen here at [facility] urgent care by Dr. Cathren Harsh 12/20/2019 for symptoms of left cervical radiculopathy, x-ray C-spine negative.  He prescribed 5-day oral prednisone burst, and prescribed #5 hydrocodone.  He advised to follow-up with specialist, managed by her PCP.   On 12/26/2019, saw Elease Hashimoto Doyle,(PCP), assessment DDD lumbar spine and acute right-sided low back pain with sciatica right side, and referred to neurology. She is on cyclobenzaprine, one half of 10 mg tablet once a day which she feels might be helping.  Denies drowsiness or side effects on this. She is no longer taking the prednisone or the hydrocodone prescribed by Dr. Cathren Harsh 12/20/2019. On 12/26/2019 was given Depo-Medrol 40 mg IM in the right ventral gluteal area, patient states that shot may have helped initially but she thinks it may have even made it worse after a few days but she is not sure. On 01/07/2020, she saw Verlee Rossetti at [facility].  For her chronic back and neck pain, a trial of gabapentin was started and order for physical therapy initiated with a plan to follow-up in 2 months.-After further questioning of patient, she states she took the gabapentin for 3 nights, did not feel any better and she stopped the gabapentin on her own, without notifying any physician. She has not been to physical therapy yet, she states "they are still arranging physical therapy appointment for me".   Past Medical History:  Diagnosis Date  . Anemia    with pregnancy  . Glaucoma   . Thyroid disease     There are no problems to display for this patient.   Past Surgical History:  Procedure Laterality Date  . COLONOSCOPY    . MINI SHUNT INSERTION Left 12/03/2013   Procedure: INSERTION OF MINI SHUNT LEFT EYE;  Surgeon: Chalmers Guest, MD;  Location: [facility] OR;  Service: Ophthalmology;  Laterality: Left;  . MITOMYCIN C APPLICATION Right 02/17/2015   Procedure: MITOMYCIN C APPLICATION RIGHT EYE;  Surgeon: Chalmers Guest, MD;  Location: [facility] OR;  Service: Ophthalmology;  Laterality: Right;  . TRABECULECTOMY Right 02/17/2015   Procedure: TRABECULECTOMY RIGHT EYE;  Surgeon: Chalmers Guest, MD;  Location: [facility] OR;  Service: Ophthalmology;  Laterality: Right;    OB History   No obstetric history on file.      Home Medications    Prior to Admission medications   Medication Sig Start Date End Date Taking? Authorizing  Provider  cyclobenzaprine (FLEXERIL) 10 MG tablet Take 10 mg by mouth as needed for muscle spasms.   Yes [provider]  gabapentin (NEURONTIN) 300 MG capsule Take 300 mg by mouth. Take 1 at bedtime for 7 days and then two capusles at bedtime for 7 days, then 3 at bedtime for 7 days   Yes [provider]  meloxicam (MOBIC) 7.5 MG tablet Take 1-2 tablets (7.5-15 mg total) by mouth daily. 12/12/19   Noe Gens, PA-C  methimazole (TAPAZOLE) 5 MG tablet TAKE 1/2 (ONE-HALF) TABLET BY MOUTH ONCE DAILY  05/27/19   [provider]  Multiple Vitamins-Minerals (MULTIVITAMIN WITH MINERALS) tablet Take 1 tablet by mouth daily.    [provider]  predniSONE (STERAPRED UNI-PAK 21 TAB) 10 MG (21) TBPK tablet Take as directed for 6 days. 01/14/20   Jacqulyn Cane, MD  Vitamin D, Ergocalciferol, (DRISDOL) 50000 UNITS CAPS capsule Take 50,000 Units by mouth every 7 (seven) days. monday    [provider]    Family History Family History  Problem Relation Age of Onset  . Glaucoma Mother   . Heart murmur Mother   . Glaucoma Father     Social History Social History   Tobacco Use  . Smoking status: Never Smoker  . Smokeless tobacco: Never Used  Substance Use Topics  . Alcohol use: No  . Drug use: No     Allergies   Patient has no known allergies.   Review of Systems Review of Systems  All other systems reviewed and are negative.    Physical Exam Triage Vital Signs ED Triage Vitals  Enc Vitals Group     BP 01/14/20 1617 (!) 143/74     Pulse Rate 01/14/20 1617 94     Resp 01/14/20 1617 17     Temp 01/14/20 1617 98.4 F (36.9 C)     Temp Source 01/14/20 1617 Oral     SpO2 01/14/20 1617 100 %     Weight --      Height --      Head Circumference --      Peak Flow --      Pain Score 01/14/20 1612 7     Pain Loc --      Pain Edu? --      Excl. in [place]? --    No data found.  Updated Vital Signs BP (!) 143/74 (BP Location: Right Arm)   Pulse 94   Temp 98.4 F (36.9 C) (Oral)   Resp 17   SpO2 100%   Visual Acuity Right Eye Distance:   Left Eye Distance:   Bilateral Distance:    Right Eye Near:   Left Eye Near:    Bilateral Near:     Physical Exam Vitals reviewed.  Constitutional:      General: She is not in acute distress.    Appearance: She is well-developed.  HENT:     Head: Normocephalic and atraumatic.  Eyes:     General: No scleral icterus.    Pupils: Pupils are equal, round, and reactive to light.  Cardiovascular:     Rate and  Rhythm: Normal rate and regular rhythm.  Pulmonary:     Effort: Pulmonary effort is normal.  Abdominal:     General: There is no distension.  Musculoskeletal:     Cervical back: Normal range of motion and neck supple.     Right lower leg: No edema.     Left lower leg: No edema.  Legs:  Skin:    General: Skin is warm and dry.     Findings: No rash.  Neurological:     Mental Status: She is alert and oriented to person, place, and time.     Cranial Nerves: No cranial nerve deficit.  Psychiatric:        Behavior: Behavior normal.      UC Treatments / Results  Labs (all labs ordered are listed, but only abnormal results are displayed) Labs Reviewed - No data to display    Radiology DG Hip Unilat W or Wo Pelvis 2-3 Views Right  Result Date: 01/14/2020 CLINICAL DATA:  Right hip and upper thigh pain for 2 weeks EXAM: DG HIP (WITH OR WITHOUT PELVIS) 2-3V RIGHT COMPARISON:  None. FINDINGS: Frontal view of the pelvis as well as frontal and frogleg lateral views of the right hip are obtained. No fracture, subluxation, or dislocation. There is mild to moderate right hip osteoarthritis with joint space narrowing and osteophyte formation. Sacroiliac joints are normal. Visualized lower lumbar spine is unremarkable. Calcifications over the pelvis likely reflect calcified uterine fibroids. IMPRESSION: 1. Asymmetric right hip osteoarthritis.  No acute fracture. Electronically Signed   By: Sharlet Salina M.D.   On: 01/14/2020 17:20    Procedures Procedures (including critical care time)  Medications Ordered in UC Medications - No data to display  Initial Impression / Assessment and Plan / UC Course  I have reviewed the triage vital signs and the nursing notes.  Pertinent labs & imaging results that were available during my care of the patient were reviewed by me and considered in my medical decision making (see chart for details).      Final Clinical Impressions(s) / UC Diagnoses     Final diagnoses:  Right hip pain  Sciatica of right side  Symptoms more likely related to inflamed nerve endings, such as right-sided sciatica.  The right hip pain could possibly be in part from her mild to moderate right hip osteoarthritis.  Discussed with patient. Red flags discussed.   Discharge Instructions     Today, x-ray right hip shows some arthritis of right hip but no fracture or dislocation. You likely have right hip and thigh pain from inflammation of nerve endings. You said that you stopped the gabapentin (which was prescribed by your neurologist) after 3 days .  You need to get back on taking the gabapentin, 1 every night as prescribed and follow-up or phone your neurologist within 1 week.-Do not stop taking medicines unless you have consulted with your physician about this. I am also sending prescription to your pharmacy for 6 days of prednisone Dosepak. I know your physical therapy appointment is pending and hopefully that appointment can be made soon by your neurologist. We will send copy of today's notes to your PCP and your neurologist.     Patient voiced understanding and agreement with above.  ED Prescriptions    Medication Sig Dispense Auth. Provider   predniSONE (STERAPRED UNI-PAK 21 TAB) 10 MG (21) TBPK tablet Take as directed for 6 days. 21 tablet Lajean Manes, MD     PDMP not reviewed this encounter.   Lajean Manes, MD 01/14/20 2351

## 2020-02-27 ENCOUNTER — Other Ambulatory Visit: Payer: Self-pay

## 2020-02-27 ENCOUNTER — Emergency Department (INDEPENDENT_AMBULATORY_CARE_PROVIDER_SITE_OTHER)
Admission: EM | Admit: 2020-02-27 | Discharge: 2020-02-27 | Disposition: A | Discharge status: Home / Self Care | Payer: Medicare PPO | Source: Home / Self Care

## 2020-02-27 DIAGNOSIS — R5383 Other fatigue: Secondary | ICD-10-CM | POA: Diagnosis not present

## 2020-02-27 DIAGNOSIS — T50905A Adverse effect of unspecified drugs, medicaments and biological substances, initial encounter: Secondary | ICD-10-CM

## 2020-02-27 DIAGNOSIS — F411 Generalized anxiety disorder: Secondary | ICD-10-CM

## 2020-02-27 DIAGNOSIS — R002 Palpitations: Secondary | ICD-10-CM | POA: Diagnosis not present

## 2020-02-27 LAB — POCT CBC W AUTO DIFF (K'VILLE URGENT CARE)

## 2020-02-27 NOTE — ED Triage Notes (Signed)
Patient went to her PCP on Tuesday for anxiety. Prescribed sertraline 50 mg, has been taking 25 mg daily since then. Complaining of fatigue and loss of appetite since starting the medication. She was also given clonazepam 0.5 mg but did not take any.

## 2020-02-27 NOTE — ED Provider Notes (Signed)
[facility]    CSN: [id number] Arrival date & time: 02/27/20  1308      History   Chief Complaint Chief Complaint  Patient presents with  . Fatigue    HPI Marissa Green is a 69 y.o. female.   HPI  Marissa Green is a 69 y.o. female presenting to UC with hx of anxiety c/o generalized weakness and fatigue that started after she was prescribed sertraline 25mg  once daily and taking 0.5mg  clonazepam.  She did not take the clonazepam today but still feels fatigued.  She called her PCP but her PCP is out of the office until next week. Pt did not ask to see if another provider in same office could see her today. Pt reports feeling anxious about Left sided tingling and flutter or soreness in Left side of chest. No hx of heart problems. Denies chest pain at this time. Denies cough, congestion, fever, chills, n/v/d/. No SOB.  She has been eating and drinking well. States she slept "real well" last night after taking the 0.5mg  clonazepam.    Past Medical History:  Diagnosis Date  . Anemia    with pregnancy  . Glaucoma   . Thyroid disease     There are no problems to display for this patient.   Past Surgical History:  Procedure Laterality Date  . COLONOSCOPY    . MINI SHUNT INSERTION Left 12/03/2013   Procedure: INSERTION OF MINI SHUNT LEFT EYE;  Surgeon: 02/02/2014, MD;  Location: [facility] OR;  Service: Ophthalmology;  Laterality: Left;  . MITOMYCIN C APPLICATION Right 02/17/2015   Procedure: MITOMYCIN C APPLICATION RIGHT EYE;  Surgeon: 02/19/2015, MD;  Location: [facility] OR;  Service: Ophthalmology;  Laterality: Right;  . TRABECULECTOMY Right 02/17/2015   Procedure: TRABECULECTOMY RIGHT EYE;  Surgeon: 02/19/2015, MD;  Location: [facility] OR;  Service: Ophthalmology;  Laterality: Right;    OB History   No obstetric history on file.      Home Medications    Prior to Admission medications   Medication Sig Start Date End Date Taking? Authorizing Provider  methimazole (TAPAZOLE) 5  MG tablet TAKE 1/2 (ONE-HALF) TABLET BY MOUTH ONCE DAILY 05/27/19  Yes [provider]  sertraline (ZOLOFT) 50 MG tablet Take by mouth. 02/24/20  Yes [provider]  clonazePAM (KLONOPIN) 0.5 MG disintegrating tablet Take 0.5-1 mg by mouth daily as needed. 02/24/20   [provider]  cyclobenzaprine (FLEXERIL) 10 MG tablet Take 10 mg by mouth as needed for muscle spasms.    [provider]  gabapentin (NEURONTIN) 300 MG capsule Take 300 mg by mouth. Take 1 at bedtime for 7 days and then two capusles at bedtime for 7 days, then 3 at bedtime for 7 days    [provider]  meloxicam (MOBIC) 7.5 MG tablet Take 1-2 tablets (7.5-15 mg total) by mouth daily. 12/12/19   12/14/19, PA-C  Multiple Vitamins-Minerals (MULTIVITAMIN WITH MINERALS) tablet Take 1 tablet by mouth daily.    [provider]  predniSONE (STERAPRED UNI-PAK 21 TAB) 10 MG (21) TBPK tablet Take as directed for 6 days. 01/14/20   01/16/20, MD  Vitamin D, Ergocalciferol, (DRISDOL) 50000 UNITS CAPS capsule Take 50,000 Units by mouth every 7 (seven) days. monday    [provider]    Family History Family History  Problem Relation Age of Onset  . Glaucoma Mother   . Heart murmur Mother   . Glaucoma Father     Social History  Social History   Tobacco Use  . Smoking status: Never Smoker  . Smokeless tobacco: Never Used  Vaping Use  . Vaping Use: Never used  Substance Use Topics  . Alcohol use: No  . Drug use: No     Allergies   Patient has no known allergies.   Review of Systems Review of Systems  Constitutional: Positive for fatigue. Negative for chills and fever.  HENT: Negative for congestion, ear pain, sore throat, trouble swallowing and voice change.   Respiratory: Negative for cough and shortness of breath.   Cardiovascular: Positive for palpitations ( Left side). Negative for chest pain.  Gastrointestinal: Negative for abdominal pain, diarrhea,  nausea and vomiting.  Musculoskeletal: Negative for arthralgias, back pain and myalgias.  Skin: Negative for rash.  Neurological: Positive for weakness. Negative for dizziness, syncope, light-headedness, numbness and headaches.  All other systems reviewed and are negative.    Physical Exam Triage Vital Signs ED Triage Vitals [02/27/20 1347]  Enc Vitals Group     BP 136/77     Pulse Rate 79     Resp 14     Temp 98.2 F (36.8 C)     Temp Source Oral     SpO2 99 %     Weight      Height      Head Circumference      Peak Flow      Pain Score      Pain Loc      Pain Edu?      Excl. in GC?    No data found.  Updated Vital Signs BP 136/77 (BP Location: Right Arm)   Pulse 79   Temp 98.2 F (36.8 C) (Oral)   Resp 14   SpO2 99%   Visual Acuity Right Eye Distance:   Left Eye Distance:   Bilateral Distance:    Right Eye Near:   Left Eye Near:    Bilateral Near:     Physical Exam Vitals and nursing note reviewed.  Constitutional:      General: She is not in acute distress.    Appearance: Normal appearance. She is well-developed. She is not ill-appearing, toxic-appearing or diaphoretic.  HENT:     Head: Normocephalic and atraumatic.     Right Ear: Tympanic membrane and ear canal normal.     Left Ear: Tympanic membrane and ear canal normal.     Nose: Nose normal.     Mouth/Throat:     Lips: Pink.     Mouth: Mucous membranes are moist.     Pharynx: Oropharynx is clear. Uvula midline.  Cardiovascular:     Rate and Rhythm: Normal rate and regular rhythm.  Pulmonary:     Effort: Pulmonary effort is normal. No respiratory distress.     Breath sounds: Normal breath sounds. No stridor. No wheezing, rhonchi or rales.  Musculoskeletal:        General: Normal range of motion.     Cervical back: Normal range of motion.  Skin:    General: Skin is warm and dry.  Neurological:     General: No focal deficit present.     Mental Status: She is alert and oriented to person,  place, and time.     Cranial Nerves: No cranial nerve deficit.     Sensory: No sensory deficit.     Motor: No weakness.     Coordination: Coordination normal.     Gait: Gait normal.  Psychiatric:  Mood and Affect: Mood normal.        Behavior: Behavior normal.      UC Treatments / Results  Labs (all labs ordered are listed, but only abnormal results are displayed) Labs Reviewed  POCT CBC W AUTO DIFF ([facility])    EKG Date/Time: 02/27/2020   14:17:02 Ventricular Rate: 81 PR Interval: 120 QRS Duration: 86 QT Interval: 364 QTC Calculation: 422 P-R-T axes: 70   0   35 Text Interpretation: Normal sinus rhythm, possible Left atrial enlargement. Borderline ECG  No change from prior EKG on 12/12/2019  Radiology No results found.  Procedures Procedures (including critical care time)  Medications Ordered in UC Medications - No data to display  Initial Impression / Assessment and Plan / UC Course  I have reviewed the triage vital signs and the nursing notes.  Pertinent labs & imaging results that were available during my care of the patient were reviewed by me and considered in my medical decision making (see chart for details).     Reassured pt of normal CBC, no signs of anemia And EKG- no change since prior EKG  Fatigue and weakness likely from her new medication Due to only taking 3-4 days of the medication, she may discontinue until f/u with PCP next week.  Encouraged fluids and rest Discussed symptoms that warrant emergent care in the ED. AVS given  Final Clinical Impressions(s) / UC Diagnoses   Final diagnoses:  Palpitations  Fatigue, unspecified type  Medication reaction, initial encounter  Generalized anxiety disorder     Discharge Instructions      Both of your anxiety medications can cause drowsiness and fatigue.  Because you have only had 3-4 doses of your medications, you may want to stop the medication or only take one of  the medications to see if this helps with your fatigue, and discuss other treatment options with your family provider next week when she is back in the office, or call to see if you can be evaluated by one of her colleagues.  Call 911 or have someone drive you to the hospital if you develop worsening symptoms, worsening weakness, fatigue, passing out, chest pain, trouble breathing, or other new concerning symptoms develop.       ED Prescriptions    None     PDMP not reviewed this encounter.   Lurene Shadow, [place] 02/27/20 1931

## 2020-02-27 NOTE — Discharge Instructions (Signed)
  Both of your anxiety medications can cause drowsiness and fatigue.  Because you have only had 3-4 doses of your medications, you may want to stop the medication or only take one of the medications to see if this helps with your fatigue, and discuss other treatment options with your family provider next week when she is back in the office, or call to see if you can be evaluated by one of her colleagues.  Call 911 or have someone drive you to the hospital if you develop worsening symptoms, worsening weakness, fatigue, passing out, chest pain, trouble breathing, or other new concerning symptoms develop.

## 2021-01-04 ENCOUNTER — Emergency Department (INDEPENDENT_AMBULATORY_CARE_PROVIDER_SITE_OTHER)
Admission: EM | Admit: 2021-01-04 | Discharge: 2021-01-04 | Disposition: A | Discharge status: Home / Self Care | Payer: Medicare PPO | Source: Home / Self Care

## 2021-01-04 ENCOUNTER — Encounter: Payer: Self-pay | Admitting: Emergency Medicine

## 2021-01-04 ENCOUNTER — Other Ambulatory Visit: Payer: Self-pay

## 2021-01-04 ENCOUNTER — Emergency Department (INDEPENDENT_AMBULATORY_CARE_PROVIDER_SITE_OTHER): Payer: Medicare PPO

## 2021-01-04 DIAGNOSIS — M542 Cervicalgia: Secondary | ICD-10-CM

## 2021-01-04 DIAGNOSIS — H9201 Otalgia, right ear: Secondary | ICD-10-CM

## 2021-01-04 DIAGNOSIS — M5010 Cervical disc disorder with radiculopathy, unspecified cervical region: Secondary | ICD-10-CM

## 2021-01-04 MED ORDER — BACLOFEN 10 MG PO TABS: 10.0000 mg | ORAL_TABLET | Freq: Three times a day (TID) | ORAL | 0 refills | Status: DC

## 2021-01-04 MED ORDER — PREDNISONE 20 MG PO TABS: ORAL_TABLET | ORAL | 0 refills | Status: DC

## 2021-01-04 NOTE — ED Provider Notes (Signed)
[facility]    CSN: [id number] Arrival date & time: 01/04/21  1228      History   Chief Complaint Chief Complaint  Patient presents with  . Otalgia    HPI Marissa Green is a 70 y.o. female.   HPI  70 year old female presents with right ear pain for 1 week.  Patient reports being evaluated by her PCP for the symptoms last week and did not find anything wrong with her.  Patient reports current pain originates in her neck and moves to her right ear and to the top of her head.  Past Medical History:  Diagnosis Date  . Anemia    with pregnancy  . Glaucoma   . Thyroid disease     There are no problems to display for this patient.   Past Surgical History:  Procedure Laterality Date  . COLONOSCOPY    . MINI SHUNT INSERTION Left 12/03/2013   Procedure: INSERTION OF MINI SHUNT LEFT EYE;  Surgeon: Chalmers Guest, MD;  Location: [facility] OR;  Service: Ophthalmology;  Laterality: Left;  . MITOMYCIN C APPLICATION Right 02/17/2015   Procedure: MITOMYCIN C APPLICATION RIGHT EYE;  Surgeon: Chalmers Guest, MD;  Location: [facility] OR;  Service: Ophthalmology;  Laterality: Right;  . TRABECULECTOMY Right 02/17/2015   Procedure: TRABECULECTOMY RIGHT EYE;  Surgeon: Chalmers Guest, MD;  Location: [facility] OR;  Service: Ophthalmology;  Laterality: Right;    OB History   No obstetric history on file.      Home Medications    Prior to Admission medications   Medication Sig Start Date End Date Taking? Authorizing Provider  baclofen (LIORESAL) 10 MG tablet Take 1 tablet (10 mg total) by mouth 3 (three) times daily. 01/04/21  Yes Trevor Iha, FNP  clonazePAM (KLONOPIN) 0.5 MG disintegrating tablet Take 0.5-1 mg by mouth daily as needed. 02/24/20  Yes [provider]  cyclobenzaprine (FLEXERIL) 10 MG tablet Take 10 mg by mouth as needed for muscle spasms.   Yes [provider]  gabapentin (NEURONTIN) 300 MG capsule Take 300 mg by mouth. Take 1 at bedtime for 7 days and then two capusles  at bedtime for 7 days, then 3 at bedtime for 7 days   Yes [provider]  meloxicam (MOBIC) 7.5 MG tablet Take 1-2 tablets (7.5-15 mg total) by mouth daily. 12/12/19  Yes Phelps, Erin O, PA-C  methimazole (TAPAZOLE) 5 MG tablet TAKE 1/2 (ONE-HALF) TABLET BY MOUTH ONCE DAILY 05/27/19  Yes [provider]  Multiple Vitamins-Minerals (MULTIVITAMIN WITH MINERALS) tablet Take 1 tablet by mouth daily.   Yes [provider]  predniSONE (DELTASONE) 20 MG tablet Take 3 tabs PO daily x 3 days, then 2 tabs PO daily x 3 days, then 1 tab PO daily x 3 days 01/04/21  Yes Trevor Iha, FNP  sertraline (ZOLOFT) 50 MG tablet Take by mouth. 02/24/20  Yes [provider]  Vitamin D, Ergocalciferol, (DRISDOL) 50000 UNITS CAPS capsule Take 50,000 Units by mouth every 7 (seven) days. monday   Yes [provider]    Family History Family History  Problem Relation Age of Onset  . Glaucoma Mother   . Heart murmur Mother   . Glaucoma Father     Social History Social History   Tobacco Use  . Smoking status: Never Smoker  . Smokeless tobacco: Never Used  Vaping Use  . Vaping Use: Never used  Substance Use Topics  . Alcohol use: No  . Drug use: No  Allergies   Patient has no known allergies.   Review of Systems Review of Systems  Constitutional: Negative.   HENT: Positive for ear pain.   Eyes: Negative.   Respiratory: Negative.   Cardiovascular: Negative.   Gastrointestinal: Negative.   Genitourinary: Negative.   Musculoskeletal: Positive for neck pain.  Skin: Negative.   Neurological: Negative.      Physical Exam Triage Vital Signs ED Triage Vitals  Enc Vitals Group     BP 01/04/21 1253 (!) 149/71     Pulse Rate 01/04/21 1253 73     Resp --      Temp 01/04/21 1253 98.4 F (36.9 C)     Temp Source 01/04/21 1253 Oral     SpO2 01/04/21 1253 98 %     Weight --      Height --      Head Circumference --      Peak Flow --      Pain Score  01/04/21 1255 7     Pain Loc --      Pain Edu? --      Excl. in GC? --    No data found.  Updated Vital Signs BP (!) 149/71 (BP Location: Right Arm)   Pulse 73   Temp 98.4 F (36.9 C) (Oral)   SpO2 98%      Physical Exam Vitals and nursing note reviewed.  Constitutional:      General: She is not in acute distress.    Appearance: Normal appearance.  HENT:     Head: Normocephalic and atraumatic.     Right Ear: Tympanic membrane, ear canal and external ear normal.     Left Ear: Tympanic membrane, ear canal and external ear normal.     Nose: Nose normal.     Mouth/Throat:     Mouth: Mucous membranes are moist.     Pharynx: Oropharynx is clear.  Eyes:     Extraocular Movements: Extraocular movements intact.     Conjunctiva/sclera: Conjunctivae normal.     Pupils: Pupils are equal, round, and reactive to light.  Neck:     Comments: C-spine: TTP over spinous processes, paraspinous muscles, and inferior rhomboids bilaterally, LROM with 4 planes of movement Cardiovascular:     Rate and Rhythm: Normal rate and regular rhythm.     Pulses: Normal pulses.     Heart sounds: Normal heart sounds.  Pulmonary:     Effort: Pulmonary effort is normal. No respiratory distress.     Breath sounds: Normal breath sounds. No wheezing, rhonchi or rales.  Skin:    General: Skin is warm and dry.  Neurological:     General: No focal deficit present.     Mental Status: She is alert and oriented to person, place, and time.  Psychiatric:        Mood and Affect: Mood normal.        Behavior: Behavior normal.      UC Treatments / Results  Labs (all labs ordered are listed, but only abnormal results are displayed) Labs Reviewed - No data to display  EKG   Radiology DG Cervical Spine Complete  Result Date: 01/04/2021 CLINICAL DATA:  Posterior neck pain for the past week. EXAM: CERVICAL SPINE - COMPLETE 4+ VIEW COMPARISON:  CT cervical spine dated August 14, 2020. FINDINGS: The lateral  view is diagnostic to the C7-T1 level. There is no acute fracture or subluxation. Vertebral body heights are preserved. Alignment is normal. Unchanged mild disc height loss  at C3-C4 and C5-C6. Unchanged mild left neuroforaminal stenosis at C3-C4 due to uncovertebral hypertrophy. Normal prevertebral soft tissues. IMPRESSION: 1. Unchanged mild cervical spondylosis as described above. Electronically Signed   By: Obie Dredge M.D.   On: 01/04/2021 14:52    Procedures Procedures (including critical care time)  Medications Ordered in UC Medications - No data to display  Initial Impression / Assessment and Plan / UC Course  I have reviewed the triage vital signs and the nursing notes.  Pertinent labs & imaging results that were available during my care of the patient were reviewed by me and considered in my medical decision making (see chart for details).     MDM: 1.  Cervicalgia. Cervical Radiculopathy Final Clinical Impressions(s) / UC Diagnoses   Final diagnoses:  Otalgia of right ear  Cervicalgia  Cervical disc disorder with radiculopathy of cervical region     Discharge Instructions     Advised/instructed patient to take medication as directed with food to completion.  Advised/instructed patient may use baclofen (10 mg) daily, as needed for surrounding muscle spasms.    ED Prescriptions    Medication Sig Dispense Auth. Provider   predniSONE (DELTASONE) 20 MG tablet Take 3 tabs PO daily x 3 days, then 2 tabs PO daily x 3 days, then 1 tab PO daily x 3 days 18 tablet Trevor Iha, FNP   baclofen (LIORESAL) 10 MG tablet Take 1 tablet (10 mg total) by mouth 3 (three) times daily. 30 each Trevor Iha, FNP     PDMP not reviewed this encounter.   Trevor Iha, FNP 01/04/21 [phone]

## 2021-01-04 NOTE — ED Triage Notes (Signed)
Patient states that she is having right ear pain x 1 week that radiates to the top of her head.  Patient seen her PCP last week and they didn't find anything.  Patient has been taken Sudafed and Nasonex.  Patient is vaccinated.

## 2021-01-04 NOTE — Discharge Instructions (Addendum)
Advised/instructed patient to take medication as directed with food to completion.  Advised/instructed patient may use baclofen (10 mg) daily, as needed for surrounding muscle spasms.

## 2021-03-23 ENCOUNTER — Encounter: Payer: Self-pay | Admitting: Emergency Medicine

## 2021-03-23 ENCOUNTER — Other Ambulatory Visit: Payer: Self-pay

## 2021-03-23 ENCOUNTER — Emergency Department (INDEPENDENT_AMBULATORY_CARE_PROVIDER_SITE_OTHER)
Admission: EM | Admit: 2021-03-23 | Discharge: 2021-03-23 | Disposition: A | Discharge status: Home / Self Care | Payer: Medicare PPO | Source: Home / Self Care | Attending: Family Medicine | Admitting: Family Medicine

## 2021-03-23 DIAGNOSIS — M4722 Other spondylosis with radiculopathy, cervical region: Secondary | ICD-10-CM | POA: Diagnosis not present

## 2021-03-23 MED ORDER — BACLOFEN 10 MG PO TABS: 10.0000 mg | ORAL_TABLET | Freq: Three times a day (TID) | ORAL | 0 refills | Status: DC

## 2021-03-23 MED ORDER — HYDROCODONE-ACETAMINOPHEN 5-325 MG PO TABS: 1.0000 | ORAL_TABLET | Freq: Four times a day (QID) | ORAL | 0 refills | Status: DC | PRN

## 2021-03-23 NOTE — ED Provider Notes (Signed)
[facility]    CSN: [id number] Arrival date & time: 03/23/21  0840      History   Chief Complaint Chief Complaint  Patient presents with   Neck Pain    HPI Marissa Green is a 70 y.o. female.   HPI  Generally healthy 70 year old retired woman.  Pleasant.  Has been under her physician's care for neck pain for the last couple of months.  She was seen here initially, and an x-ray found mild to moderate spondylosis.  She has pain in her neck with radiation into the left arm, predominantly small finger.  She has numbness in her ring and small fingers.  She states the pain is unremitting.  She has gone to physical therapy.  She has taken prescribed medication including Naprosyn and baclofen.  She is out of her prescription medications at this time.  She states she cannot sleep.  When last seen by her physician she was referred to an orthopedic spine specialist.  That appointment is not until another 2 weeks.  Past Medical History:  Diagnosis Date   Anemia    with pregnancy   Glaucoma    Thyroid disease     There are no problems to display for this patient.   Past Surgical History:  Procedure Laterality Date   COLONOSCOPY     MINI SHUNT INSERTION Left 12/03/2013   Procedure: INSERTION OF MINI SHUNT LEFT EYE;  Surgeon: Chalmers Guest, MD;  Location: [facility] OR;  Service: Ophthalmology;  Laterality: Left;   MITOMYCIN C APPLICATION Right 02/17/2015   Procedure: MITOMYCIN C APPLICATION RIGHT EYE;  Surgeon: Chalmers Guest, MD;  Location: [facility] OR;  Service: Ophthalmology;  Laterality: Right;   TRABECULECTOMY Right 02/17/2015   Procedure: TRABECULECTOMY RIGHT EYE;  Surgeon: Chalmers Guest, MD;  Location: [facility] OR;  Service: Ophthalmology;  Laterality: Right;    OB History   No obstetric history on file.      Home Medications    Prior to Admission medications   Medication Sig Start Date End Date Taking? Authorizing Provider  HYDROcodone-acetaminophen (NORCO/VICODIN) 5-325 MG tablet  Take 1-2 tablets by mouth every 6 (six) hours as needed. 03/23/21  Yes Eustace Moore, MD  baclofen (LIORESAL) 10 MG tablet Take 1 tablet (10 mg total) by mouth 3 (three) times daily. 03/23/21   Eustace Moore, MD  clonazePAM (KLONOPIN) 0.5 MG disintegrating tablet Take 0.5-1 mg by mouth daily as needed. 02/24/20   [provider]  gabapentin (NEURONTIN) 300 MG capsule Take 300 mg by mouth. Take 1 at bedtime for 7 days and then two capusles at bedtime for 7 days, then 3 at bedtime for 7 days    [provider]  meloxicam (MOBIC) 7.5 MG tablet Take 1-2 tablets (7.5-15 mg total) by mouth daily. 12/12/19   Lurene Shadow, PA-C  methimazole (TAPAZOLE) 5 MG tablet TAKE 1/2 (ONE-HALF) TABLET BY MOUTH ONCE DAILY 05/27/19   [provider]  Multiple Vitamins-Minerals (MULTIVITAMIN WITH MINERALS) tablet Take 1 tablet by mouth daily.    [provider]  sertraline (ZOLOFT) 50 MG tablet Take by mouth. 02/24/20   [provider]  Vitamin D, Ergocalciferol, (DRISDOL) 50000 UNITS CAPS capsule Take 50,000 Units by mouth every 7 (seven) days. monday    [provider]    Family History Family History  Problem Relation Age of Onset   Glaucoma Mother    Heart murmur Mother    Glaucoma Father     Social History Social History  Tobacco Use   Smoking status: Never   Smokeless tobacco: Never  Vaping Use   Vaping Use: Never used  Substance Use Topics   Alcohol use: No   Drug use: No     Allergies   Patient has no known allergies.   Review of Systems Review of Systems See HPI  Physical Exam Triage Vital Signs ED Triage Vitals  Enc Vitals Group     BP 03/23/21 0901 116/73     Pulse Rate 03/23/21 0901 68     Resp --      Temp --      Temp src --      SpO2 03/23/21 0901 99 %     Weight 03/23/21 0902 140 lb (63.5 kg)     Height 03/23/21 0902 5\' 3"  (1.6 m)     Head Circumference --      Peak Flow --      Pain Score 03/23/21 0902 8      Pain Loc --      Pain Edu? --      Excl. in GC? --    No data found.  Updated Vital Signs BP 116/73 (BP Location: Right Arm)   Pulse 68   Ht 5\' 3"  (1.6 m)   Wt 63.5 kg   SpO2 99%   BMI 24.80 kg/m       Physical Exam Constitutional:      General: She is not in acute distress.    Appearance: Normal appearance. She is well-developed and normal weight.     Comments: Mask in place  HENT:     Head: Normocephalic and atraumatic.  Eyes:     Conjunctiva/sclera: Conjunctivae normal.     Pupils: Pupils are equal, round, and reactive to light.  Cardiovascular:     Rate and Rhythm: Normal rate.  Pulmonary:     Effort: Pulmonary effort is normal. No respiratory distress.  Abdominal:     General: There is no distension.     Palpations: Abdomen is soft.  Musculoskeletal:        General: Normal range of motion.     Cervical back: Normal range of motion. Tenderness present.  Skin:    General: Skin is warm and dry.  Neurological:     General: No focal deficit present.     Mental Status: She is alert.     Sensory: Sensory deficit present.     Motor: No weakness.     Coordination: Coordination normal.     Gait: Gait normal.     Deep Tendon Reflexes: Reflexes normal.     Comments: Reported sensory deficit on left small finger.  Good grip.  Normal reflexes both upper extremities  Psychiatric:        Mood and Affect: Mood normal.        Behavior: Behavior normal.     UC Treatments / Results  Labs (all labs ordered are listed, but only abnormal results are displayed) Labs Reviewed - No data to display  EKG   Radiology No results found.  Procedures Procedures (including critical care time)  Medications Ordered in UC Medications - No data to display  Initial Impression / Assessment and Plan / UC Course  I have reviewed the triage vital signs and the nursing notes.  Pertinent labs & imaging results that were available during my care of the patient were reviewed by me  and considered in my medical decision making (see chart for details).     Patient  has chronic symptoms from her spondylosis, for the last 2 and half months.  She has been treated conservatively with anti-inflammatories, steroids, muscle relaxers, and physical therapy.  We will give her medicine for pain management and refill her muscle relaxer at this time.  Encourage follow-up with spine specialty.  Patient indicated that she felt like she needed additional imaging.  I let her know that the decision regarding imaging needed to be made by the specialist Final Clinical Impressions(s) / UC Diagnoses   Final diagnoses:  Cervical spondylosis with radiculopathy     Discharge Instructions      Take the baclofen as needed as a muscle relaxer Take the hydrocodone for severe pain Do not drive on the hydrocodone  You have been referred to an orthopedic spine specialist   ED Prescriptions     Medication Sig Dispense Auth. Provider   baclofen (LIORESAL) 10 MG tablet Take 1 tablet (10 mg total) by mouth 3 (three) times daily. 30 each Eustace Moore, MD   HYDROcodone-acetaminophen (NORCO/VICODIN) 5-325 MG tablet Take 1-2 tablets by mouth every 6 (six) hours as needed. 10 tablet Eustace Moore, MD      I have reviewed the PDMP during this encounter.   Eustace Moore, MD 03/23/21 1110

## 2021-03-23 NOTE — ED Triage Notes (Signed)
Patient c/o recurrent neck pain that radiates down left arm.  Patient has seen her PCP and a therapists for the pain.  Patient is unable to sleep due to pain.

## 2021-03-23 NOTE — Discharge Instructions (Addendum)
Take the baclofen as needed as a muscle relaxer Take the hydrocodone for severe pain Do not drive on the hydrocodone  You have been referred to an orthopedic spine specialist

## 2022-02-07 ENCOUNTER — Emergency Department (INDEPENDENT_AMBULATORY_CARE_PROVIDER_SITE_OTHER)
Admission: EM | Admit: 2022-02-07 | Discharge: 2022-02-07 | Disposition: A | Discharge status: Home / Self Care | Payer: Medicare PPO | Source: Home / Self Care | Attending: Family Medicine | Admitting: Family Medicine

## 2022-02-07 ENCOUNTER — Emergency Department (INDEPENDENT_AMBULATORY_CARE_PROVIDER_SITE_OTHER): Payer: Medicare PPO

## 2022-02-07 ENCOUNTER — Encounter: Payer: Self-pay | Admitting: Emergency Medicine

## 2022-02-07 DIAGNOSIS — R053 Chronic cough: Secondary | ICD-10-CM

## 2022-02-07 DIAGNOSIS — R059 Cough, unspecified: Secondary | ICD-10-CM

## 2022-02-07 MED ORDER — DOXYCYCLINE HYCLATE 100 MG PO CAPS: ORAL_CAPSULE | ORAL | 0 refills | Status: DC

## 2022-02-07 NOTE — Discharge Instructions (Signed)
Take plain guaifenesin (1200mg  extended release tabs such as Mucinex) twice daily, with plenty of water, for cough and congestion.  Get adequate rest.   May take Delsym Cough Suppressant ("12 Hour Cough Relief") at bedtime for nighttime cough.  May take one Tessalon perle at bedtime also if needed for cough. Stop all antihistamines for now, and other non-prescription cough/cold preparations.

## 2022-02-07 NOTE — ED Triage Notes (Signed)
Non-productive cough x 1 month  Congestion  Cough kept pt awake last night Tessalon perles 01/20/22 - took for 10 days  Denies fever or chills

## 2022-02-07 NOTE — ED Provider Notes (Signed)
[facility]    CSN: [id number] Arrival date & time: 02/07/22  0858      History   Chief Complaint Chief Complaint  Patient presents with   Cough    HPI Marissa Green is a 71 y.o. female.   Patient developed typical URI symptoms about 1.5 months ago.  Her husband had a similar illness at that time (now resolved).  Her symptoms gradually resolved except for a persistent cough that is now keeping her awake at night.  She denies shortness of breath, fevers, chills, and sweats, and feels well otherwise.  Her cough has not improved with Tessalon perles.  The history is provided by the patient.    Past Medical History:  Diagnosis Date   Anemia    with pregnancy   Glaucoma    Thyroid disease     There are no problems to display for this patient.   Past Surgical History:  Procedure Laterality Date   COLONOSCOPY     MINI SHUNT INSERTION Left 12/03/2013   Procedure: INSERTION OF MINI SHUNT LEFT EYE;  Surgeon: Chalmers Guest, MD;  Location: [facility] OR;  Service: Ophthalmology;  Laterality: Left;   MITOMYCIN C APPLICATION Right 02/17/2015   Procedure: MITOMYCIN C APPLICATION RIGHT EYE;  Surgeon: Chalmers Guest, MD;  Location: [facility] OR;  Service: Ophthalmology;  Laterality: Right;   TRABECULECTOMY Right 02/17/2015   Procedure: TRABECULECTOMY RIGHT EYE;  Surgeon: Chalmers Guest, MD;  Location: [facility] OR;  Service: Ophthalmology;  Laterality: Right;    OB History   No obstetric history on file.      Home Medications    Prior to Admission medications   Medication Sig Start Date End Date Taking? Authorizing Provider  doxycycline (VIBRAMYCIN) 100 MG capsule Take one cap PO Q12hr with food. 02/07/22  Yes Lattie Haw, MD  B Complex-C (B-COMPLEX WITH VITAMIN C) tablet Take 1 tablet by mouth daily.    [provider]  baclofen (LIORESAL) 10 MG tablet Take 1 tablet (10 mg total) by mouth 3 (three) times daily. Patient not taking: Reported on 02/07/2022 03/23/21   Eustace Moore, MD  betamethasone, augmented, (DIPROLENE) 0.05 % lotion SMARTSIG:Topical Daily on Weekdays 01/30/22   [provider]  busPIRone (BUSPAR) 5 MG tablet SMARTSIG:1 Tablet(s) By Mouth Morning-Night 12/01/21   [provider]  Cholecalciferol (VITAMIN D3) 10 MCG (400 UNIT) tablet Take by mouth.    [provider]  clonazePAM (KLONOPIN) 0.5 MG disintegrating tablet Take 0.5-1 mg by mouth daily as needed. Patient not taking: Reported on 02/07/2022 02/24/20   [provider]  gabapentin (NEURONTIN) 300 MG capsule Take 300 mg by mouth. Take 1 at bedtime for 7 days and then two capusles at bedtime for 7 days, then 3 at bedtime for 7 days Patient not taking: Reported on 02/07/2022    [provider]  HYDROcodone-acetaminophen (NORCO/VICODIN) 5-325 MG tablet Take 1-2 tablets by mouth every 6 (six) hours as needed. Patient not taking: Reported on 02/07/2022 03/23/21   Eustace Moore, MD  meclizine (ANTIVERT) 25 MG tablet Take 25 mg by mouth 3 (three) times daily as needed. 10/14/21   [provider]  meloxicam (MOBIC) 7.5 MG tablet Take 1-2 tablets (7.5-15 mg total) by mouth daily. Patient not taking: Reported on 02/07/2022 12/12/19   Lurene Shadow, PA-C  methimazole (TAPAZOLE) 5 MG tablet TAKE 1/2 (ONE-HALF) TABLET BY MOUTH ONCE DAILY 05/27/19   [provider]  Multiple Vitamins-Minerals (MULTIVITAMIN WITH MINERALS) tablet Take 1  tablet by mouth daily.    [provider]  sertraline (ZOLOFT) 50 MG tablet Take by mouth. Patient not taking: Reported on 02/07/2022 02/24/20   [provider]  timolol (TIMOPTIC) 0.5 % ophthalmic solution Place 1 drop into the left eye every morning. 11/17/21   [provider]  Vitamin D, Ergocalciferol, (DRISDOL) 50000 UNITS CAPS capsule Take 50,000 Units by mouth every 7 (seven) days. monday Patient not taking: Reported on 02/07/2022    [provider]  VYZULTA 0.024 % SOLN SMARTSIG:1  Drop(s) In Eye(s) Every Evening 11/27/21   [provider]    Family History Family History  Problem Relation Age of Onset   Glaucoma Mother    Heart murmur Mother    Glaucoma Father     Social History Social History   Tobacco Use   Smoking status: Never   Smokeless tobacco: Never  Vaping Use   Vaping Use: Never used  Substance Use Topics   Alcohol use: No   Drug use: No     Allergies   Patient has no known allergies.   Review of Systems Review of Systems No sore throat + cough No pleuritic pain No wheezing + nasal congestion No post-nasal drainage No sinus pain/pressure No itchy/red eyes No earache No hemoptysis No SOB No fever/chills No nausea No vomiting No abdominal pain No diarrhea No urinary symptoms No skin rash No fatigue No myalgias No headache   Physical Exam Triage Vital Signs ED Triage Vitals  Enc Vitals Group     BP 02/07/22 0914 (!) 150/80     Pulse Rate 02/07/22 0914 66     Resp 02/07/22 0914 17     Temp 02/07/22 0914 98.7 F (37.1 C)     Temp Source 02/07/22 0914 Oral     SpO2 02/07/22 0914 100 %     Weight 02/07/22 0917 140 lb (63.5 kg)     Height 02/07/22 0917 5\' 3"  (1.6 m)     Head Circumference --      Peak Flow --      Pain Score 02/07/22 0917 0     Pain Loc --      Pain Edu? --      Excl. in GC? --    No data found.  Updated Vital Signs BP (!) 150/80 (BP Location: Right Arm)   Pulse 66   Temp 98.7 F (37.1 C) (Oral)   Resp 17   Ht 5\' 3"  (1.6 m)   Wt 63.5 kg   SpO2 100%   BMI 24.80 kg/m   Visual Acuity Right Eye Distance:   Left Eye Distance:   Bilateral Distance:    Right Eye Near:   Left Eye Near:    Bilateral Near:     Physical Exam Nursing notes and Vital Signs reviewed. Appearance:  Patient appears stated age, and in no acute distress Eyes:  Pupils are equal, round, and reactive to light and accomodation.  Extraocular movement is intact.  Conjunctivae are not inflamed  Ears:  Canals  normal.  Tympanic membranes normal.  Nose:  Normal turbinates.  No sinus tenderness.   Pharynx:  Normal Neck:  Supple.  No adenopathy.  Lungs:  Clear to auscultation.  Breath sounds are equal.  Moving air well. Heart:  Regular rate and rhythm without murmurs, rubs, or gallops.  Abdomen:  Nontender without masses or hepatosplenomegaly.  Bowel sounds are present.  No CVA or flank tenderness.  Extremities:  No edema.  Skin:  No rash present.   UC Treatments / Results  Labs (all labs ordered are listed, but only abnormal results are displayed) Labs Reviewed - No data to display  EKG   Radiology DG Chest 2 View  Result Date: 02/07/2022 CLINICAL DATA:  Cough. EXAM: CHEST - 2 VIEW COMPARISON:  Jan 05, 2022. FINDINGS: The heart size and mediastinal contours are within normal limits. Both lungs are clear. The visualized skeletal structures are unremarkable. IMPRESSION: No active cardiopulmonary disease. Electronically Signed   By: Lupita Raider M.D.   On: 02/07/2022 09:53    Procedures Procedures (including critical care time)  Medications Ordered in UC Medications - No data to display  Initial Impression / Assessment and Plan / UC Course  I have reviewed the triage vital signs and the nursing notes.  Pertinent labs & imaging results that were available during my care of the patient were reviewed by me and considered in my medical decision making (see chart for details).    Chest x-ray negative and exam benign.  Because of prolonged cough, will treat as a bronchitis with doxycycline for five days.  Followup with Family Doctor if not improved in one week.   Final Clinical Impressions(s) / UC Diagnoses   Final diagnoses:  Persistent cough for 3 weeks or longer     Discharge Instructions      Take plain guaifenesin (1200mg  extended release tabs such as Mucinex) twice daily, with plenty of water, for cough and congestion.  Get adequate rest.   May take Delsym Cough Suppressant  ("12 Hour Cough Relief") at bedtime for nighttime cough.  May take one Tessalon perle at bedtime also if needed for cough. Stop all antihistamines for now, and other non-prescription cough/cold preparations.        ED Prescriptions     Medication Sig Dispense Auth. Provider   doxycycline (VIBRAMYCIN) 100 MG capsule Take one cap PO Q12hr with food. 10 capsule , MD         Lattie Haw, MD 02/08/22 [id number]

## 2022-02-12 ENCOUNTER — Emergency Department (HOSPITAL_BASED_OUTPATIENT_CLINIC_OR_DEPARTMENT_OTHER)
Admission: EM | Admit: 2022-02-12 | Discharge: 2022-02-12 | Disposition: A | Discharge status: Home / Self Care | Payer: Medicare PPO | Attending: Emergency Medicine | Admitting: Emergency Medicine

## 2022-02-12 ENCOUNTER — Encounter (HOSPITAL_BASED_OUTPATIENT_CLINIC_OR_DEPARTMENT_OTHER): Payer: Self-pay

## 2022-02-12 ENCOUNTER — Emergency Department (HOSPITAL_BASED_OUTPATIENT_CLINIC_OR_DEPARTMENT_OTHER): Payer: Medicare PPO | Admitting: Radiology

## 2022-02-12 ENCOUNTER — Other Ambulatory Visit: Payer: Self-pay

## 2022-02-12 DIAGNOSIS — R052 Subacute cough: Secondary | ICD-10-CM | POA: Diagnosis not present

## 2022-02-12 DIAGNOSIS — R059 Cough, unspecified: Secondary | ICD-10-CM | POA: Diagnosis present

## 2022-02-12 LAB — CBC
HCT: 35.4 % — ABNORMAL LOW (ref 36.0–46.0)
Hemoglobin: 11.7 g/dL — ABNORMAL LOW (ref 12.0–15.0)
MCH: 30.2 pg (ref 26.0–34.0)
MCHC: 33.1 g/dL (ref 30.0–36.0)
MCV: 91.5 fL (ref 80.0–100.0)
Platelets: 278 10*3/uL (ref 150–400)
RBC: 3.87 MIL/uL (ref 3.87–5.11)
RDW: 11.8 % (ref 11.5–15.5)
WBC: 6.6 10*3/uL (ref 4.0–10.5)
nRBC: 0 % (ref 0.0–0.2)

## 2022-02-12 LAB — BASIC METABOLIC PANEL
Anion gap: 8 (ref 5–15)
BUN: 9 mg/dL (ref 8–23)
CO2: 28 mmol/L (ref 22–32)
Calcium: 10 mg/dL (ref 8.9–10.3)
Chloride: 104 mmol/L (ref 98–111)
Creatinine, Ser: 0.84 mg/dL (ref 0.44–1.00)
GFR, Estimated: >60 mL/min (ref >60)
Glucose, Bld: 106 mg/dL — ABNORMAL HIGH (ref 70–99)
Potassium: 3.8 mmol/L (ref 3.5–5.1)
Sodium: 140 mmol/L (ref 135–145)

## 2022-02-12 MED ORDER — GUAIFENESIN ER 1200 MG PO TB12: 1.0000 | ORAL_TABLET | Freq: Two times a day (BID) | ORAL | 0 refills | Status: AC

## 2022-02-12 MED ORDER — PANTOPRAZOLE SODIUM 20 MG PO TBEC: 20.0000 mg | DELAYED_RELEASE_TABLET | Freq: Every day | ORAL | 0 refills | Status: AC

## 2022-02-12 NOTE — ED Notes (Signed)
Pt now returned from Xray via w/c -- remains awake and alert; no acute changes noted.

## 2022-02-12 NOTE — ED Provider Notes (Signed)
[facility] EMERGENCY DEPT Provider Note   CSN: [id number] Arrival date & time: 02/12/22  1834     History  Chief Complaint  Patient presents with   Cough    Marissa Green is a 71 y.o. female.   Cough   Patient has history of anemia, glaucoma, thyroid disease.  Patient states she has had a chest congestion for the last month.  Patient feels like something been in her chest like mucus.  She has not felt like she can quite get it out.  She has had some burping but is not having any trouble with swallowing.  She does not have any burning discomfort in her chest.  She has not had any trouble with eating or drinking.  Patient states she does have a cough but it is not constant and sometimes is worse at night.  She has not had any fevers.  No vomiting or diarrhea.  Patient went to an urgent care last week and was started on a course of antibiotics.  She actually feels much better after that but does not feel like that congestion sensation in his chest is completely gone  Home Medications Prior to Admission medications   Medication Sig Start Date End Date Taking? Authorizing Provider  Guaifenesin 1200 MG TB12 Take 1 tablet (1,200 mg total) by mouth 2 (two) times daily at 10 AM and 5 PM. 02/12/22  Yes Linwood Dibbles, MD  pantoprazole (PROTONIX) 20 MG tablet Take 1 tablet (20 mg total) by mouth daily for 10 days. 02/12/22 02/22/22 Yes Linwood Dibbles, MD  B Complex-C (B-COMPLEX WITH VITAMIN C) tablet Take 1 tablet by mouth daily.    [provider]  baclofen (LIORESAL) 10 MG tablet Take 1 tablet (10 mg total) by mouth 3 (three) times daily. Patient not taking: Reported on 02/07/2022 03/23/21   Eustace Moore, MD  betamethasone, augmented, (DIPROLENE) 0.05 % lotion SMARTSIG:Topical Daily on Weekdays 01/30/22   [provider]  busPIRone (BUSPAR) 5 MG tablet SMARTSIG:1 Tablet(s) By Mouth Morning-Night 12/01/21   [provider]  Cholecalciferol (VITAMIN D3) 10 MCG  (400 UNIT) tablet Take by mouth.    [provider]  clonazePAM (KLONOPIN) 0.5 MG disintegrating tablet Take 0.5-1 mg by mouth daily as needed. Patient not taking: Reported on 02/07/2022 02/24/20   [provider]  doxycycline (VIBRAMYCIN) 100 MG capsule Take one cap PO Q12hr with food. 02/07/22   Lattie Haw, MD  gabapentin (NEURONTIN) 300 MG capsule Take 300 mg by mouth. Take 1 at bedtime for 7 days and then two capusles at bedtime for 7 days, then 3 at bedtime for 7 days Patient not taking: Reported on 02/07/2022    [provider]  HYDROcodone-acetaminophen (NORCO/VICODIN) 5-325 MG tablet Take 1-2 tablets by mouth every 6 (six) hours as needed. Patient not taking: Reported on 02/07/2022 03/23/21   Eustace Moore, MD  meclizine (ANTIVERT) 25 MG tablet Take 25 mg by mouth 3 (three) times daily as needed. 10/14/21   [provider]  meloxicam (MOBIC) 7.5 MG tablet Take 1-2 tablets (7.5-15 mg total) by mouth daily. Patient not taking: Reported on 02/07/2022 12/12/19   Lurene Shadow, PA-C  methimazole (TAPAZOLE) 5 MG tablet TAKE 1/2 (ONE-HALF) TABLET BY MOUTH ONCE DAILY 05/27/19   [provider]  Multiple Vitamins-Minerals (MULTIVITAMIN WITH MINERALS) tablet Take 1 tablet by mouth daily.    [provider]  sertraline (ZOLOFT) 50 MG tablet Take by mouth. Patient not taking: Reported on 02/07/2022  02/24/20   [provider]  timolol (TIMOPTIC) 0.5 % ophthalmic solution Place 1 drop into the left eye every morning. 11/17/21   [provider]  Vitamin D, Ergocalciferol, (DRISDOL) 50000 UNITS CAPS capsule Take 50,000 Units by mouth every 7 (seven) days. monday Patient not taking: Reported on 02/07/2022    [provider]  VYZULTA 0.024 % SOLN SMARTSIG:1 Drop(s) In Eye(s) Every Evening 11/27/21   [provider]      Allergies    Patient has no known allergies.    Review of Systems   Review of Systems   Respiratory:  Positive for cough.     Physical Exam Updated Vital Signs BP (!) 141/68   Pulse 62   Temp 99 F (37.2 C) (Oral)   Resp 20   Ht 1.6 m (5\' 3" )   Wt 63.5 kg   SpO2 98%   BMI 24.80 kg/m  Physical Exam Vitals and nursing note reviewed.  Constitutional:      General: She is not in acute distress.    Appearance: She is well-developed.  HENT:     Head: Normocephalic and atraumatic.     Right Ear: External ear normal.     Left Ear: External ear normal.  Eyes:     General: No scleral icterus.       Right eye: No discharge.        Left eye: No discharge.     Conjunctiva/sclera: Conjunctivae normal.  Neck:     Trachea: No tracheal deviation.  Cardiovascular:     Rate and Rhythm: Normal rate and regular rhythm.  Pulmonary:     Effort: Pulmonary effort is normal. No respiratory distress.     Breath sounds: Normal breath sounds. No stridor. No wheezing or rales.  Abdominal:     General: Bowel sounds are normal. There is no distension.     Palpations: Abdomen is soft.     Tenderness: There is no abdominal tenderness. There is no guarding or rebound.  Musculoskeletal:        General: No tenderness or deformity.     Cervical back: Neck supple.  Skin:    General: Skin is warm and dry.     Findings: No rash.  Neurological:     General: No focal deficit present.     Mental Status: She is alert.     Cranial Nerves: No cranial nerve deficit (no facial droop, extraocular movements intact, no slurred speech).     Sensory: No sensory deficit.     Motor: No abnormal muscle tone or seizure activity.     Coordination: Coordination normal.  Psychiatric:        Mood and Affect: Mood normal.     ED Results / Procedures / Treatments   Labs (all labs ordered are listed, but only abnormal results are displayed) Labs Reviewed  CBC - Abnormal; Notable for the following components:      Result Value   Hemoglobin 11.7 (*)    HCT 35.4 (*)    All other components within  normal limits  BASIC METABOLIC PANEL - Abnormal; Notable for the following components:   Glucose, Bld 106 (*)    All other components within normal limits    EKG EKG Interpretation  Date/Time:  Sunday February 12 2022 18:43:29 EDT Ventricular Rate:  69 PR Interval:  138 QRS Duration: 80 QT Interval:  380 QTC Calculation: 407 R Axis:   -8 Text Interpretation: Normal sinus rhythm Possible Left atrial enlargement  Borderline ECG When compared with ECG of 27-Feb-2020 14:17, nonspecific t wave changes Confirmed by Linwood Dibbles [phone]) on 02/12/2022 6:55:09 PM  Radiology DG Chest 2 View  Result Date: 02/12/2022 CLINICAL DATA:  cough EXAM: CHEST - 2 VIEW COMPARISON:  Chest x-ray 02/07/2022 FINDINGS: The heart and mediastinal contours are within normal limits. Biapical pleural/pulmonary scarring. No focal consolidation. No pulmonary edema. No pleural effusion. No pneumothorax. No acute osseous abnormality. IMPRESSION: No active cardiopulmonary disease. Electronically Signed   By: Tish Frederickson M.D.   On: 02/12/2022 19:45    Procedures Procedures    Medications Ordered in ED Medications - No data to display  ED Course/ Medical Decision Making/ A&P Clinical Course as of 02/12/22 2218  Wynelle Link Feb 12, 2022  2106 DG Chest 2 View Chest x-ray images and radiology report reviewed.  No pneumonia noted [JK]  2106 CBC(!) Normal [JK]  2210 Basic metabolic panel(!) Normal [JK]    Clinical Course User Index [JK] Linwood Dibbles, MD                           Medical Decision Making Amount and/or Complexity of Data Reviewed External Data Reviewed: notes. Labs: ordered. Decision-making details documented in ED Course. Radiology: ordered and independent interpretation performed. Decision-making details documented in ED Course.  Risk OTC drugs. Prescription drug management.   Patient presented to the ED for evaluation of cough.  Symptoms ongoing for several weeks.  Patient states she has noticed  improvement after a course of antibiotics but not completely resolved.  Suggest possible infectious component.  Chest x-ray does not show any pneumonia.  She is breathing easily.  No oxygen requirement.  Patient does mention some burping.  There could be a component of acid reflux.  We will have her try her Mucinex to help with mucus feeling in her chest.  We will also try a course of antacids in case there is a component of acid reflux.  Discussed outpatient follow-up with PCP  Evaluation and diagnostic testing in the emergency department does not suggest an emergent condition requiring admission or immediate intervention beyond what has been performed at this time.  The patient is safe for discharge and has been instructed to return immediately for worsening symptoms, change in symptoms or any other concerns.         Final Clinical Impression(s) / ED Diagnoses Final diagnoses:  Subacute cough    Rx / DC Orders ED Discharge Orders          Ordered    Guaifenesin 1200 MG TB12  2 times daily        02/12/22 2216    pantoprazole (PROTONIX) 20 MG tablet  Daily        02/12/22 2216              Linwood Dibbles, MD 02/12/22 2220

## 2022-02-12 NOTE — ED Notes (Signed)
Pt agreeable with d/c plan as discussed by provider- this nurse has verbally reinforced d/c instructions and provided written copy -- pt acknowledges verbal understanding and denies any additional questions, concerns, needs- pt ambulatory at d/c independently with steady gait; no distress; vitals stable.

## 2022-02-12 NOTE — ED Notes (Signed)
Pt awake and alert lying in bed with visitor at bedside.  No acute distress observed.  RR even and unlabored on RA with symmetrical rise and fall of chest; no coughing noted at this time.   Pt on continuous pulse ox and cardiac monitoring.  Awaits lab results.  Pt denies any immediate needs, questions, concerns - oriented to call bell.  Will continue to monitor for acute changes and maintain plan of care.

## 2022-02-12 NOTE — Discharge Instructions (Signed)
Take the medications as prescribed to help with the congestion.  Follow-up with your doctor later this week to be rechecked

## 2022-02-12 NOTE — ED Triage Notes (Signed)
Patient here POV from Home.  Endorses being at UC 5 days PTA for a Cough she had been experiencing for several Weeks that had resulted in Chest Congestion and Nausea. Given Doxycycline but Patient endorses continued Symptoms.   Cough is Fairly Dry.  NAD Noted during Triage. A&Ox4. GCS 15. Ambulatory.

## 2022-03-09 ENCOUNTER — Emergency Department
Admission: EM | Admit: 2022-03-09 | Discharge: 2022-03-09 | Disposition: A | Discharge status: Home / Self Care | Payer: Medicare PPO | Attending: Family Medicine | Admitting: Family Medicine

## 2022-03-09 DIAGNOSIS — E042 Nontoxic multinodular goiter: Secondary | ICD-10-CM

## 2022-03-09 DIAGNOSIS — R053 Chronic cough: Secondary | ICD-10-CM | POA: Diagnosis not present

## 2022-03-09 MED ORDER — GUAIFENESIN-CODEINE 100-10 MG/5ML PO SOLN: ORAL | 0 refills | Status: DC

## 2022-03-09 MED ORDER — PREDNISONE 20 MG PO TABS: ORAL_TABLET | ORAL | 0 refills | Status: DC

## 2022-03-09 NOTE — ED Provider Notes (Signed)
[facility]    CSN: [id number] Arrival date & time: 03/09/22  1348      History   Chief Complaint Chief Complaint  Patient presents with   Cough   Nasal Congestion    drainage    HPI Marissa Green is a 71 y.o. female.   Patient reports that her previous cough for which she was evaluated/treated on 02/07/22 had essentially resolved.  Two days ago she developed increased post-nasal drainage and her cough increased somewhat.  At the same time she noticed increased soreness in her right anterior neck.  She feels well otherwise, denying fever, chest pain, shortness of breath, etc.  She denies a history of GERD. She does have a history of multinodular thyroid, followed by endocrinologist.  She states that her next followup visit will be in [place].  The history is provided by the patient.  Cough Cough characteristics:  Productive Sputum characteristics:  Clear Severity:  Mild Onset quality:  Gradual Duration:  2 days Timing:  Intermittent Progression:  Unchanged Chronicity:  Recurrent Smoker: no   Context: not animal exposure, not exposure to allergens, not smoke exposure, not upper respiratory infection and not weather changes   Relieved by:  None tried Worsened by:  Nothing Ineffective treatments:  None tried Associated symptoms: sinus congestion   Associated symptoms: no chest pain, no chills, no diaphoresis, no ear fullness, no fever, no myalgias, no shortness of breath and no sore throat     Past Medical History:  Diagnosis Date   Anemia    with pregnancy   Glaucoma    Thyroid disease     There are no problems to display for this patient.   Past Surgical History:  Procedure Laterality Date   COLONOSCOPY     MINI SHUNT INSERTION Left 12/03/2013   Procedure: INSERTION OF MINI SHUNT LEFT EYE;  Surgeon: Chalmers Guest, MD;  Location: [facility] OR;  Service: Ophthalmology;  Laterality: Left;   MITOMYCIN C APPLICATION Right 02/17/2015   Procedure: MITOMYCIN C  APPLICATION RIGHT EYE;  Surgeon: Chalmers Guest, MD;  Location: [facility] OR;  Service: Ophthalmology;  Laterality: Right;   TRABECULECTOMY Right 02/17/2015   Procedure: TRABECULECTOMY RIGHT EYE;  Surgeon: Chalmers Guest, MD;  Location: [facility] OR;  Service: Ophthalmology;  Laterality: Right;    OB History   No obstetric history on file.      Home Medications    Prior to Admission medications   Medication Sig Start Date End Date Taking? Authorizing Provider  guaiFENesin-codeine 100-10 MG/5ML syrup Take 55mL by mouth at bedtime as needed for cough. 03/09/22  Yes Lattie Haw, MD  predniSONE (DELTASONE) 20 MG tablet Take one tab by mouth twice daily for 4 days, then one daily. Take with food. 03/09/22  Yes Lattie Haw, MD  B Complex-C (B-COMPLEX WITH VITAMIN C) tablet Take 1 tablet by mouth daily.    [provider]  baclofen (LIORESAL) 10 MG tablet Take 1 tablet (10 mg total) by mouth 3 (three) times daily. Patient not taking: Reported on 02/07/2022 03/23/21   Eustace Moore, MD  betamethasone, augmented, (DIPROLENE) 0.05 % lotion SMARTSIG:Topical Daily on Weekdays 01/30/22   [provider]  busPIRone (BUSPAR) 5 MG tablet SMARTSIG:1 Tablet(s) By Mouth Morning-Night 12/01/21   [provider]  Cholecalciferol (VITAMIN D3) 10 MCG (400 UNIT) tablet Take by mouth.    [provider]  clonazePAM (KLONOPIN) 0.5 MG disintegrating tablet Take 0.5-1 mg by mouth daily as needed. Patient not taking: Reported  on 02/07/2022 02/24/20   [provider]  doxycycline (VIBRAMYCIN) 100 MG capsule Take one cap PO Q12hr with food. 02/07/22   Lattie Haw, MD  gabapentin (NEURONTIN) 300 MG capsule Take 300 mg by mouth. Take 1 at bedtime for 7 days and then two capusles at bedtime for 7 days, then 3 at bedtime for 7 days Patient not taking: Reported on 02/07/2022    [provider]  Guaifenesin 1200 MG TB12 Take 1 tablet (1,200 mg total) by mouth 2 (two) times daily at  10 AM and 5 PM. 02/12/22   Linwood Dibbles, MD  HYDROcodone-acetaminophen (NORCO/VICODIN) 5-325 MG tablet Take 1-2 tablets by mouth every 6 (six) hours as needed. Patient not taking: Reported on 02/07/2022 03/23/21   Eustace Moore, MD  meclizine (ANTIVERT) 25 MG tablet Take 25 mg by mouth 3 (three) times daily as needed. 10/14/21   [provider]  meloxicam (MOBIC) 7.5 MG tablet Take 1-2 tablets (7.5-15 mg total) by mouth daily. Patient not taking: Reported on 02/07/2022 12/12/19   Lurene Shadow, PA-C  methimazole (TAPAZOLE) 5 MG tablet TAKE 1/2 (ONE-HALF) TABLET BY MOUTH ONCE DAILY 05/27/19   [provider]  Multiple Vitamins-Minerals (MULTIVITAMIN WITH MINERALS) tablet Take 1 tablet by mouth daily.    [provider]  pantoprazole (PROTONIX) 20 MG tablet Take 1 tablet (20 mg total) by mouth daily for 10 days. 02/12/22 02/22/22  Linwood Dibbles, MD  sertraline (ZOLOFT) 50 MG tablet Take by mouth. Patient not taking: Reported on 02/07/2022 02/24/20   [provider]  timolol (TIMOPTIC) 0.5 % ophthalmic solution Place 1 drop into the left eye every morning. 11/17/21   [provider]  Vitamin D, Ergocalciferol, (DRISDOL) 50000 UNITS CAPS capsule Take 50,000 Units by mouth every 7 (seven) days. monday Patient not taking: Reported on 02/07/2022    [provider]  VYZULTA 0.024 % SOLN SMARTSIG:1 Drop(s) In Eye(s) Every Evening 11/27/21   [provider]    Family History Family History  Problem Relation Age of Onset   Glaucoma Mother    Heart murmur Mother    Glaucoma Father     Social History Social History   Tobacco Use   Smoking status: Never   Smokeless tobacco: Never  Vaping Use   Vaping Use: Never used  Substance Use Topics   Alcohol use: No   Drug use: No     Allergies   Patient has no known allergies.   Review of Systems Review of Systems  Constitutional:  Negative for chills, diaphoresis and fever.  HENT:  Negative for  sore throat.   Respiratory:  Positive for cough. Negative for shortness of breath.   Cardiovascular:  Negative for chest pain.  Musculoskeletal:  Negative for myalgias.     Physical Exam Triage Vital Signs ED Triage Vitals  Enc Vitals Group     BP 03/09/22 1358 137/76     Pulse Rate 03/09/22 1358 75     Resp 03/09/22 1358 17     Temp 03/09/22 1358 98.6 F (37 C)     Temp Source 03/09/22 1358 Oral     SpO2 03/09/22 1358 98 %     Weight --      Height --      Head Circumference --      Peak Flow --      Pain Score 03/09/22 1359 5     Pain Loc --      Pain Edu? --  Excl. in GC? --    No data found.  Updated Vital Signs BP 137/76 (BP Location: Right Arm)   Pulse 75   Temp 98.6 F (37 C) (Oral)   Resp 17   SpO2 98%   Visual Acuity Right Eye Distance:   Left Eye Distance:   Bilateral Distance:    Right Eye Near:   Left Eye Near:    Bilateral Near:     Physical Exam Vitals and nursing note reviewed.  Constitutional:      General: She is not in acute distress.    Appearance: She is not ill-appearing.  HENT:     Head: Normocephalic.     Right Ear: Tympanic membrane normal.     Left Ear: Tympanic membrane normal.     Nose: Nose normal. No congestion.     Mouth/Throat:     Mouth: Mucous membranes are moist.     Pharynx: Oropharynx is clear.  Eyes:     Extraocular Movements: Extraocular movements intact.     Pupils: Pupils are equal, round, and reactive to light.  Neck:     Thyroid: Thyroid tenderness present.     Comments: Right edge of thyroid is mildly swollen, nodular, and tender to palpation. Cardiovascular:     Rate and Rhythm: Normal rate and regular rhythm.     Heart sounds: Normal heart sounds.  Pulmonary:     Breath sounds: Normal breath sounds.  Abdominal:     General: Abdomen is flat.     Palpations: Abdomen is soft.     Tenderness: There is no abdominal tenderness.  Musculoskeletal:        General: No tenderness.     Cervical back:  Normal range of motion and neck supple.     Right lower leg: No edema.     Left lower leg: No edema.  Lymphadenopathy:     Cervical: No cervical adenopathy.  Skin:    General: Skin is warm and dry.     Findings: No rash.  Neurological:     Mental Status: She is alert and oriented to person, place, and time.      UC Treatments / Results  Labs (all labs ordered are listed, but only abnormal results are displayed) Labs Reviewed - No data to display  EKG   Radiology No results found.  Procedures Procedures (including critical care time)  Medications Ordered in UC Medications - No data to display  Initial Impression / Assessment and Plan / UC Course  I have reviewed the triage vital signs and the nursing notes.  Pertinent labs & imaging results that were available during my care of the patient were reviewed by me and considered in my medical decision making (see chart for details).     Patient appears to have mild recurrent thyroid inflammation.  Begin prednisone burst/taper. Rx for Robitussin [facility] for night time cough (at patient's request). Followup with endocrinologist earlier than September if right thryoid remains tender.  Final Clinical Impressions(s) / UC Diagnoses   Final diagnoses:  Persistent cough  Multinodular thyroid     Discharge Instructions      May continue Mucinex daytime for cough     ED Prescriptions     Medication Sig Dispense Auth. Provider   predniSONE (DELTASONE) 20 MG tablet Take one tab by mouth twice daily for 4 days, then one daily. Take with food. 12 tablet Lattie Haw, MD   guaiFENesin-codeine 100-10 MG/5ML syrup Take 19mL by mouth at bedtime as  needed for cough. 50 mL Lattie Haw, MD         Lattie Haw, MD 03/09/22 [phone]

## 2022-03-09 NOTE — ED Triage Notes (Signed)
Pt c/o continued cough since last month. States some drainage started 2 days ago. Also c/o soreness in RT ear and below ear in neck. Taking Robitussin prn.

## 2022-03-09 NOTE — Discharge Instructions (Signed)
May continue Mucinex daytime for cough

## 2022-06-10 ENCOUNTER — Ambulatory Visit
Admission: EM | Admit: 2022-06-10 | Discharge: 2022-06-10 | Disposition: A | Discharge status: Home / Self Care | Payer: Medicare PPO | Attending: Family Medicine | Admitting: Family Medicine

## 2022-06-10 ENCOUNTER — Ambulatory Visit (INDEPENDENT_AMBULATORY_CARE_PROVIDER_SITE_OTHER): Payer: Medicare PPO

## 2022-06-10 ENCOUNTER — Encounter: Payer: Self-pay | Admitting: Emergency Medicine

## 2022-06-10 DIAGNOSIS — K5900 Constipation, unspecified: Secondary | ICD-10-CM

## 2022-06-10 DIAGNOSIS — R109 Unspecified abdominal pain: Secondary | ICD-10-CM | POA: Diagnosis not present

## 2022-06-10 DIAGNOSIS — R1084 Generalized abdominal pain: Secondary | ICD-10-CM

## 2022-06-10 NOTE — Discharge Instructions (Signed)
Drink lots of water Use a fleets enema at home Take miralax every day that you do not have a BM Eat high fiber diet

## 2022-06-10 NOTE — ED Triage Notes (Signed)
Patient c/o stomach ache and constipation x 2 weeks.  No nausea or vomiting.  Patient has been taken Sucralfate 1grm. Patient does have gas which relieves  some of the pressure.  Patient also taken Ex-lax as well.

## 2022-06-10 NOTE — ED Provider Notes (Signed)
[facility]    CSN: [id number] Arrival date & time: 06/10/22  1059      History   Chief Complaint Chief Complaint  Patient presents with   Stomach Ache    HPI Marissa Green is a 71 y.o. female.   HPI  Patient is here for evaluation of constipation.  She states she feels bloated and like she needs to use the bathroom but cannot.  She has tried some Ex-Lax.  She has tried sucralfate which was prescribed to her previously.  None of this has helped.  She is trying to drink enough water.  She has not changed her diet. Patient does have hyperthyroidism but her most recent TSH performed in 05/16/2022 was normal at 0.61  Past Medical History:  Diagnosis Date   Anemia    with pregnancy   Glaucoma    Thyroid disease     There are no problems to display for this patient.   Past Surgical History:  Procedure Laterality Date   COLONOSCOPY     MINI SHUNT INSERTION Left 12/03/2013   Procedure: INSERTION OF MINI SHUNT LEFT EYE;  Surgeon: Chalmers Guest, MD;  Location: [facility] OR;  Service: Ophthalmology;  Laterality: Left;   MITOMYCIN C APPLICATION Right 02/17/2015   Procedure: MITOMYCIN C APPLICATION RIGHT EYE;  Surgeon: Chalmers Guest, MD;  Location: [facility] OR;  Service: Ophthalmology;  Laterality: Right;   TRABECULECTOMY Right 02/17/2015   Procedure: TRABECULECTOMY RIGHT EYE;  Surgeon: Chalmers Guest, MD;  Location: [facility] OR;  Service: Ophthalmology;  Laterality: Right;    OB History   No obstetric history on file.      Home Medications    Prior to Admission medications   Medication Sig Start Date End Date Taking? Authorizing Provider  B Complex-C (B-COMPLEX WITH VITAMIN C) tablet Take 1 tablet by mouth daily.   Yes [provider]  betamethasone, augmented, (DIPROLENE) 0.05 % lotion SMARTSIG:Topical Daily on Weekdays 01/30/22  Yes [provider]  busPIRone (BUSPAR) 5 MG tablet SMARTSIG:1 Tablet(s) By Mouth Morning-Night 12/01/21  Yes [provider]   Cholecalciferol (VITAMIN D3) 10 MCG (400 UNIT) tablet Take by mouth.   Yes [provider]  meclizine (ANTIVERT) 25 MG tablet Take 25 mg by mouth 3 (three) times daily as needed. 10/14/21  Yes [provider]  VYZULTA 0.024 % SOLN SMARTSIG:1 Drop(s) In Eye(s) Every Evening 11/27/21  Yes [provider]  doxycycline (VIBRAMYCIN) 100 MG capsule Take one cap PO Q12hr with food. 02/07/22   Lattie Haw, MD  Guaifenesin 1200 MG TB12 Take 1 tablet (1,200 mg total) by mouth 2 (two) times daily at 10 AM and 5 PM. 02/12/22   Linwood Dibbles, MD  methimazole (TAPAZOLE) 5 MG tablet TAKE 1/2 (ONE-HALF) TABLET BY MOUTH ONCE DAILY 05/27/19   [provider]  Multiple Vitamins-Minerals (MULTIVITAMIN WITH MINERALS) tablet Take 1 tablet by mouth daily.    [provider]  pantoprazole (PROTONIX) 20 MG tablet Take 1 tablet (20 mg total) by mouth daily for 10 days. 02/12/22 02/22/22  Linwood Dibbles, MD  timolol (TIMOPTIC) 0.5 % ophthalmic solution Place 1 drop into the left eye every morning. 11/17/21   [provider]    Family History Family History  Problem Relation Age of Onset   Glaucoma Mother    Heart murmur Mother    Glaucoma Father     Social History Social History   Tobacco Use   Smoking status: Never   Smokeless tobacco: Never  Vaping  Use   Vaping Use: Never used  Substance Use Topics   Alcohol use: No   Drug use: No     Allergies   Patient has no known allergies.   Review of Systems Review of Systems See HPI  Physical Exam Triage Vital Signs ED Triage Vitals  Enc Vitals Group     BP 06/10/22 1139 (!) 147/77     Pulse Rate 06/10/22 1139 74     Resp 06/10/22 1139 18     Temp 06/10/22 1139 99.2 F (37.3 C)     Temp Source 06/10/22 1139 Oral     SpO2 06/10/22 1139 99 %     Weight 06/10/22 1141 144 lb (65.3 kg)     Height 06/10/22 1141 5\' 3"  (1.6 m)     Head Circumference --      Peak Flow --      Pain Score 06/10/22 1141 5      Pain Loc --      Pain Edu? --      Excl. in GC? --    No data found.  Updated Vital Signs BP (!) 147/77 (BP Location: Left Arm)   Pulse 74   Temp 99.2 F (37.3 C) (Oral)   Resp 18   Ht 5\' 3"  (1.6 m)   Wt 65.3 kg   SpO2 99%   BMI 25.51 kg/m      Physical Exam Constitutional:      General: She is not in acute distress.    Appearance: She is well-developed.  HENT:     Head: Normocephalic and atraumatic.  Eyes:     Conjunctiva/sclera: Conjunctivae normal.     Pupils: Pupils are equal, round, and reactive to light.  Cardiovascular:     Rate and Rhythm: Normal rate and regular rhythm.     Heart sounds: Normal heart sounds.  Pulmonary:     Effort: Pulmonary effort is normal. No respiratory distress.     Breath sounds: Normal breath sounds.  Abdominal:     General: Abdomen is flat. Bowel sounds are normal. There is no distension.     Palpations: Abdomen is soft.     Tenderness: There is no abdominal tenderness.  Musculoskeletal:        General: Normal range of motion.     Cervical back: Normal range of motion.  Skin:    General: Skin is warm and dry.  Neurological:     Mental Status: She is alert.      UC Treatments / Results  Labs (all labs ordered are listed, but only abnormal results are displayed) Labs Reviewed - No data to display  EKG   Radiology DG Abdomen 1 View  Result Date: 06/10/2022 CLINICAL DATA:  Abdominal pain, constipation EXAM: ABDOMEN - 1 VIEW COMPARISON:  None Available. FINDINGS: Nonobstructive pattern of bowel gas with gas present to the rectum. Large burden of stool throughout the left and right colon. Calcified fibroids and phleboliths in the low pelvis. No radio-opaque calculi or other significant radiographic abnormality are seen. IMPRESSION: Nonobstructive pattern of bowel gas with gas present to the rectum. Large burden of stool throughout the left and right colon. Electronically Signed   By: M.D.   On: 06/10/2022 12:51     Procedures Procedures (including critical care time)  Medications Ordered in UC Medications - No data to display  Initial Impression / Assessment and Plan / UC Course  I have reviewed the triage vital signs and the nursing notes.  Pertinent labs & imaging results that were available during my care of the patient were reviewed by me and considered in my medical decision making (see chart for details).     Discussed treatment of constipation at home.  Discussed prevention Final Clinical Impressions(s) / UC Diagnoses   Final diagnoses:  Generalized abdominal pain  Constipation, unspecified constipation type     Discharge Instructions      Drink lots of water Use a fleets enema at home Take miralax every day that you do not have a BM Eat high fiber diet     ED Prescriptions   None    PDMP not reviewed this encounter.   Raylene Everts, MD 06/10/22 1415

## 2022-06-21 ENCOUNTER — Ambulatory Visit
Admission: EM | Admit: 2022-06-21 | Discharge: 2022-06-21 | Disposition: A | Discharge status: Home / Self Care | Payer: Medicare PPO | Attending: Family Medicine | Admitting: Family Medicine

## 2022-06-21 DIAGNOSIS — R197 Diarrhea, unspecified: Secondary | ICD-10-CM | POA: Diagnosis not present

## 2022-06-21 MED ORDER — DICYCLOMINE HCL 20 MG PO TABS: 20.0000 mg | ORAL_TABLET | Freq: Two times a day (BID) | ORAL | 0 refills | Status: AC

## 2022-06-21 NOTE — Discharge Instructions (Addendum)
Advised patient may take Bentyl daily, as needed for diarrhea.  Advised patient if diarrhea/loose stools continue or go unresolved please follow-up with PCP, GI or here for further evaluation with serological testing.  Encouraged patient to increase daily water intake while taking this medication.

## 2022-06-21 NOTE — ED Provider Notes (Signed)
[facility]    CSN: [id number] Arrival date & time: 06/21/22  1204      History   Chief Complaint Chief Complaint  Patient presents with   Diarrhea    HPI Marissa Green is a 71 y.o. female.   HPI Very pleasant 71 year old female presents with urgent care with diarrhea, bloating, gas and intermittent abdominal pain for 1 week.  Reports was evaluated here on 1014/23 for previous issue.  PMH significant for anemia, glaucoma, and thyroid disease.  Patient denies current abdominal pain and reports intermittent diarrhea/loose stool and last formed stool was 2 to 3 days ago.  Past Medical History:  Diagnosis Date   Anemia    with pregnancy   Glaucoma    Thyroid disease     There are no problems to display for this patient.   Past Surgical History:  Procedure Laterality Date   COLONOSCOPY     MINI SHUNT INSERTION Left 12/03/2013   Procedure: INSERTION OF MINI SHUNT LEFT EYE;  Surgeon: Marylynn Pearson, MD;  Location: [place];  Service: Ophthalmology;  Laterality: Left;   MITOMYCIN C APPLICATION Right [id number]   Procedure: MITOMYCIN C APPLICATION RIGHT EYE;  Surgeon: Marylynn Pearson, MD;  Location: [place];  Service: Ophthalmology;  Laterality: Right;   TRABECULECTOMY Right 02/17/2015   Procedure: TRABECULECTOMY RIGHT EYE;  Surgeon: Marylynn Pearson, MD;  Location: [place];  Service: Ophthalmology;  Laterality: Right;    OB History   No obstetric history on file.      Home Medications    Prior to Admission medications   Medication Sig Start Date End Date Taking? Authorizing Provider  dicyclomine (BENTYL) 20 MG tablet Take 1 tablet (20 mg total) by mouth 2 (two) times daily. 06/21/22  Yes Eliezer Lofts, FNP  meclizine (ANTIVERT) 25 MG tablet Take 25 mg by mouth 3 (three) times daily as needed. 10/14/21  Yes [provider]  B Complex-C (B-COMPLEX WITH VITAMIN C) tablet Take 1 tablet by mouth daily.    [provider]  betamethasone, augmented, (DIPROLENE)  0.05 % lotion SMARTSIG:Topical Daily on Weekdays 01/30/22   [provider]  busPIRone (BUSPAR) 5 MG tablet SMARTSIG:1 Tablet(s) By Mouth Morning-Night 12/01/21   [provider]  Cholecalciferol (VITAMIN D3) 10 MCG (400 UNIT) tablet Take by mouth.    [provider]  doxycycline (VIBRAMYCIN) 100 MG capsule Take one cap PO Q12hr with food. 02/07/22   Kandra Nicolas, MD  Guaifenesin 1200 MG TB12 Take 1 tablet (1,200 mg total) by mouth 2 (two) times daily at 10 AM and 5 PM. 02/12/22   Dorie Rank, MD  methimazole (TAPAZOLE) 5 MG tablet TAKE 1/2 (ONE-HALF) TABLET BY MOUTH ONCE DAILY 05/27/19   [provider]  Multiple Vitamins-Minerals (MULTIVITAMIN WITH MINERALS) tablet Take 1 tablet by mouth daily.    [provider]  pantoprazole (PROTONIX) 20 MG tablet Take 1 tablet (20 mg total) by mouth daily for 10 days. 02/12/22 02/22/22  Dorie Rank, MD  timolol (TIMOPTIC) 0.5 % ophthalmic solution Place 1 drop into the left eye every morning. 11/17/21   [provider]  VYZULTA 0.024 % SOLN SMARTSIG:1 Drop(s) In Eye(s) Every Evening 11/27/21   [provider]    Family History Family History  Problem Relation Age of Onset   Glaucoma Mother    Heart murmur Mother    Glaucoma Father     Social History Social History   Tobacco Use   Smoking status: Never   Smokeless  tobacco: Never  Vaping Use   Vaping Use: Never used  Substance Use Topics   Alcohol use: No   Drug use: No     Allergies   Patient has no known allergies.   Review of Systems Review of Systems  Gastrointestinal:  Positive for abdominal pain and diarrhea.       Bloating/gas x1 week  All other systems reviewed and are negative.    Physical Exam Triage Vital Signs ED Triage Vitals  Enc Vitals Group     BP 06/21/22 1222 131/76     Pulse Rate 06/21/22 1222 70     Resp 06/21/22 1222 20     Temp 06/21/22 1222 98.1 F (36.7 C)     Temp Source 06/21/22 1222 Oral      SpO2 06/21/22 1222 100 %     Weight 06/21/22 1216 144 lb (65.3 kg)     Height 06/21/22 1216 5\' 3"  (1.6 m)     Head Circumference --      Peak Flow --      Pain Score 06/21/22 1216 0     Pain Loc --      Pain Edu? --      Excl. in [place]? --    No data found.  Updated Vital Signs BP 131/76 (BP Location: Right Arm)   Pulse 70   Temp 98.1 F (36.7 C) (Oral)   Resp 20   Ht 5\' 3"  (1.6 m)   Wt 144 lb (65.3 kg)   SpO2 100%   BMI 25.51 kg/m      Physical Exam Vitals and nursing note reviewed.  Constitutional:      Appearance: Normal appearance. She is normal weight.  HENT:     Head: Normocephalic and atraumatic.     Mouth/Throat:     Mouth: Mucous membranes are moist.     Pharynx: Oropharynx is clear.  Eyes:     Extraocular Movements: Extraocular movements intact.     Conjunctiva/sclera: Conjunctivae normal.     Pupils: Pupils are equal, round, and reactive to light.  Cardiovascular:     Rate and Rhythm: Normal rate and regular rhythm.     Pulses: Normal pulses.     Heart sounds: Normal heart sounds.  Pulmonary:     Effort: Pulmonary effort is normal.     Breath sounds: Normal breath sounds. No wheezing, rhonchi or rales.  Abdominal:     General: There is no distension.     Palpations: There is no mass.     Tenderness: There is no right CVA tenderness, left CVA tenderness, guarding or rebound.     Hernia: No hernia is present.  Musculoskeletal:        General: Normal range of motion.     Cervical back: Normal range of motion and neck supple.  Skin:    General: Skin is warm and dry.  Neurological:     General: No focal deficit present.     Mental Status: She is alert and oriented to person, place, and time. Mental status is at baseline.      UC Treatments / Results  Labs (all labs ordered are listed, but only abnormal results are displayed) Labs Reviewed - No data to display  EKG   Radiology No results found.  Procedures Procedures (including critical  care time)  Medications Ordered in UC Medications - No data to display  Initial Impression / Assessment and Plan / UC Course  I have reviewed the triage vital  signs and the nursing notes.  Pertinent labs & imaging results that were available during my care of the patient were reviewed by me and considered in my medical decision making (see chart for details).     MDM: 1.  Diarrhea, unspecified-Rx'd Bentyl. Advised patient may take Bentyl daily, as needed for diarrhea.  Advised patient if diarrhea/loose stools continue or go unresolved please follow-up with PCP, GI or here for further evaluation with serological testing.  Encouraged patient to increase daily water intake while taking this medication.  Patient discharged home, hemodynamically stable. Final Clinical Impressions(s) / UC Diagnoses   Final diagnoses:  Diarrhea, unspecified type     Discharge Instructions      Advised patient may take Bentyl daily, as needed for diarrhea.  Advised patient if diarrhea/loose stools continue or go unresolved please follow-up with PCP, GI or here for further evaluation with serological testing.  Encouraged patient to increase daily water intake while taking this medication.     ED Prescriptions     Medication Sig Dispense Auth. Provider   dicyclomine (BENTYL) 20 MG tablet Take 1 tablet (20 mg total) by mouth 2 (two) times daily. 20 tablet Eliezer Lofts, FNP      PDMP not reviewed this encounter.   Eliezer Lofts, [place] 06/21/22 1324

## 2022-06-21 NOTE — ED Triage Notes (Addendum)
Pt presents to Urgent Care with c/o diarrhea, bloating, gas, and intermittent abdominal pain x approx one week. Was seen here for stomachache on 06/10/22 and advised to use Fleets enema and take Miralax each day that she did not have a BM. Reports doing the Fleets enema and "several" doses of Miralax before diarrhea began. States she has taken two doses of pepto bismul over the past two days w/ some relief.

## 2022-06-22 ENCOUNTER — Emergency Department (HOSPITAL_BASED_OUTPATIENT_CLINIC_OR_DEPARTMENT_OTHER)
Admission: EM | Admit: 2022-06-22 | Discharge: 2022-06-22 | Disposition: A | Discharge status: Home / Self Care | Payer: Medicare PPO | Attending: Emergency Medicine | Admitting: Emergency Medicine

## 2022-06-22 ENCOUNTER — Emergency Department (HOSPITAL_BASED_OUTPATIENT_CLINIC_OR_DEPARTMENT_OTHER): Payer: Medicare PPO

## 2022-06-22 ENCOUNTER — Encounter (HOSPITAL_BASED_OUTPATIENT_CLINIC_OR_DEPARTMENT_OTHER): Payer: Self-pay | Admitting: Emergency Medicine

## 2022-06-22 ENCOUNTER — Other Ambulatory Visit: Payer: Self-pay

## 2022-06-22 DIAGNOSIS — R1031 Right lower quadrant pain: Secondary | ICD-10-CM | POA: Insufficient documentation

## 2022-06-22 DIAGNOSIS — R1032 Left lower quadrant pain: Secondary | ICD-10-CM | POA: Insufficient documentation

## 2022-06-22 HISTORY — DX: Anxiety disorder, unspecified: F41.9

## 2022-06-22 LAB — CBC WITH DIFFERENTIAL/PLATELET
Abs Immature Granulocytes: 0.01 10*3/uL (ref 0.00–0.07)
Basophils Absolute: 0 10*3/uL (ref 0.0–0.1)
Basophils Relative: 1 %
Eosinophils Absolute: 0.1 10*3/uL (ref 0.0–0.5)
Eosinophils Relative: 2 %
HCT: 39.6 % (ref 36.0–46.0)
Hemoglobin: 12.9 g/dL (ref 12.0–15.0)
Immature Granulocytes: 0 %
Lymphocytes Relative: 27 %
Lymphs Abs: 1.5 10*3/uL (ref 0.7–4.0)
MCH: 30.4 pg (ref 26.0–34.0)
MCHC: 32.6 g/dL (ref 30.0–36.0)
MCV: 93.2 fL (ref 80.0–100.0)
Monocytes Absolute: 0.5 10*3/uL (ref 0.1–1.0)
Monocytes Relative: 10 %
Neutro Abs: 3.3 10*3/uL (ref 1.7–7.7)
Neutrophils Relative %: 60 %
Platelets: 261 10*3/uL (ref 150–400)
RBC: 4.25 MIL/uL (ref 3.87–5.11)
RDW: 11.7 % (ref 11.5–15.5)
WBC: 5.4 10*3/uL (ref 4.0–10.5)
nRBC: 0 % (ref 0.0–0.2)

## 2022-06-22 LAB — COMPREHENSIVE METABOLIC PANEL
ALT: 19 U/L (ref 0–44)
AST: 18 U/L (ref 15–41)
Albumin: 4.7 g/dL (ref 3.5–5.0)
Alkaline Phosphatase: 71 U/L (ref 38–126)
Anion gap: 7 (ref 5–15)
BUN: 12 mg/dL (ref 8–23)
CO2: 29 mmol/L (ref 22–32)
Calcium: 10 mg/dL (ref 8.9–10.3)
Chloride: 105 mmol/L (ref 98–111)
Creatinine, Ser: 0.8 mg/dL (ref 0.44–1.00)
GFR, Estimated: >60 mL/min (ref >60)
Glucose, Bld: 98 mg/dL (ref 70–99)
Potassium: 3.8 mmol/L (ref 3.5–5.1)
Sodium: 141 mmol/L (ref 135–145)
Total Bilirubin: 0.4 mg/dL (ref 0.3–1.2)
Total Protein: 7.4 g/dL (ref 6.5–8.1)

## 2022-06-22 MED ORDER — IOHEXOL 300 MG/ML  SOLN
100.0000 mL | Freq: Once | INTRAMUSCULAR | Status: AC | PRN
  Administered 2022-06-22: 100 mL via INTRAVENOUS

## 2022-06-22 NOTE — ED Triage Notes (Signed)
Lower abdominal pain since 06/10/22.  Pt states she was constipated, got miralax, then progressed to diarrhea.  Then she stopped miralax, and started on bentyl yesterday.  Pt states she is still having discomfort in lower abdominal area.

## 2022-06-22 NOTE — ED Notes (Signed)
Presents with abd discomfort, states had an episode of constipation and was placed on a stool softener, then developed diarrhea. Denies any abd pain currently. Denies N/V as well, does feel slightly distended in abd, non-tender, BS WNL

## 2022-06-22 NOTE — ED Provider Notes (Signed)
[facility] Provider Note   CSN: [id number] Arrival date & time: 06/22/22  9379     History  Chief Complaint  Patient presents with   Abdominal Pain    Marissa Green is a 71 y.o. female.  Patient is a 71 year old female with a history of thyroid disease who is presenting today due to ongoing lower abdominal pain and gas and cramping.  Patient reports everything started approximately 3 weeks ago.  She initially started with constipation and went to urgent care.  They told her to start MiraLAX which she did but then several days later started having diarrhea and the cramping.  She went and saw them yesterday because her symptoms were not improving.  She reports during the day they were okay but at night she would have significantly more discomfort.  Yesterday they gave her dicyclomine which she reports has helped some with the discomfort and this morning she did have a normal stool but she is still not feeling back to normal and was concerned.  She has not had any nausea or vomiting.  She has been eating and denies that eating makes symptoms significantly worse.  She denies any urinary symptoms or fever.  No prior abdominal surgeries.  Her last colonoscopy was about 10 years ago but reports that it was normal at the time.  The history is provided by the patient and medical records.  Abdominal Pain      Home Medications Prior to Admission medications   Medication Sig Start Date End Date Taking? Authorizing Provider  B Complex-C (B-COMPLEX WITH VITAMIN C) tablet Take 1 tablet by mouth daily.    [provider]  betamethasone, augmented, (DIPROLENE) 0.05 % lotion SMARTSIG:Topical Daily on Weekdays 01/30/22   [provider]  busPIRone (BUSPAR) 5 MG tablet SMARTSIG:1 Tablet(s) By Mouth Morning-Night 12/01/21   [provider]  Cholecalciferol (VITAMIN D3) 10 MCG (400 UNIT) tablet Take by mouth.    [provider]  dicyclomine  (BENTYL) 20 MG tablet Take 1 tablet (20 mg total) by mouth 2 (two) times daily. 06/21/22   Eliezer Lofts, FNP  doxycycline (VIBRAMYCIN) 100 MG capsule Take one cap PO Q12hr with food. 02/07/22   Kandra Nicolas, MD  Guaifenesin 1200 MG TB12 Take 1 tablet (1,200 mg total) by mouth 2 (two) times daily at 10 AM and 5 PM. 02/12/22   Dorie Rank, MD  meclizine (ANTIVERT) 25 MG tablet Take 25 mg by mouth 3 (three) times daily as needed. 10/14/21   [provider]  methimazole (TAPAZOLE) 5 MG tablet TAKE 1/2 (ONE-HALF) TABLET BY MOUTH ONCE DAILY 05/27/19   [provider]  Multiple Vitamins-Minerals (MULTIVITAMIN WITH MINERALS) tablet Take 1 tablet by mouth daily.    [provider]  pantoprazole (PROTONIX) 20 MG tablet Take 1 tablet (20 mg total) by mouth daily for 10 days. 02/12/22 02/22/22  Dorie Rank, MD  timolol (TIMOPTIC) 0.5 % ophthalmic solution Place 1 drop into the left eye every morning. 11/17/21   [provider]  VYZULTA 0.024 % SOLN SMARTSIG:1 Drop(s) In Eye(s) Every Evening 11/27/21   [provider]      Allergies    Molds & smuts    Review of Systems   Review of Systems  Gastrointestinal:  Positive for abdominal pain.    Physical Exam Updated Vital Signs BP 135/80   Pulse 66   Temp 98.2 F (36.8 C)   Resp 18   Ht 5\' 3"  (1.6 m)  Wt 65.3 kg   SpO2 100%   BMI 25.51 kg/m  Physical Exam Vitals and nursing note reviewed.  Constitutional:      General: She is not in acute distress.    Appearance: She is well-developed.  HENT:     Head: Normocephalic and atraumatic.  Eyes:     Pupils: Pupils are equal, round, and reactive to light.  Cardiovascular:     Rate and Rhythm: Normal rate and regular rhythm.     Heart sounds: Normal heart sounds. No murmur heard.    No friction rub.  Pulmonary:     Effort: Pulmonary effort is normal.     Breath sounds: Normal breath sounds. No wheezing or rales.  Abdominal:     General: Bowel sounds are  normal. There is no distension.     Palpations: Abdomen is soft.     Tenderness: There is abdominal tenderness in the suprapubic area. There is no guarding or rebound.     Comments: Mild lower abdominal pain without rebound or guarding  Musculoskeletal:        General: No tenderness. Normal range of motion.     Comments: No edema  Skin:    General: Skin is warm and dry.     Findings: No rash.  Neurological:     Mental Status: She is alert and oriented to person, place, and time.     Cranial Nerves: No cranial nerve deficit.  Psychiatric:        Behavior: Behavior normal.     ED Results / Procedures / Treatments   Labs (all labs ordered are listed, but only abnormal results are displayed) Labs Reviewed  CBC WITH DIFFERENTIAL/PLATELET  COMPREHENSIVE METABOLIC PANEL    EKG None  Radiology CT ABDOMEN PELVIS W CONTRAST  Result Date: 06/22/2022 CLINICAL DATA:  Left lower quadrant pain. EXAM: CT ABDOMEN AND PELVIS WITH CONTRAST TECHNIQUE: Multidetector CT imaging of the abdomen and pelvis was performed using the standard protocol following bolus administration of intravenous contrast. RADIATION DOSE REDUCTION: This exam was performed according to the departmental dose-optimization program which includes automated exposure control, adjustment of the mA and/or kV according to patient size and/or use of iterative reconstruction technique. CONTRAST:  142mL OMNIPAQUE IOHEXOL 300 MG/ML  SOLN COMPARISON:  None Available. FINDINGS: Lower chest: The lung bases are clear. The imaged heart is unremarkable. Hepatobiliary: The liver and gallbladder are unremarkable. There is no biliary ductal dilatation. Pancreas: Unremarkable. Spleen: Unremarkable. Adrenals/Urinary Tract: The adrenals are unremarkable. There are multiple small hypodense lesions in the liver which likely reflect benign cysts but requiring no specific imaging follow-up. There are no suspicious parenchymal lesions. There are no stones.  There is no hydronephrosis or hydroureter. There is symmetric excretion of contrast into the collecting systems on the delayed images. Stomach/Bowel: The stomach is unremarkable. There is no evidence of bowel obstruction. There is no abnormal bowel wall thickening or inflammatory change. There is colonic diverticulosis without evidence of acute diverticulitis. There is a moderate stool burden throughout the colon. The appendix is normal. Vascular/Lymphatic: The abdominal aorta is normal in course and caliber. The major branch vessels are patent. The main portal and splenic veins are patent. There is no abdominal or pelvic lymphadenopathy. Reproductive: Multiple calcified and noncalcified uterine fibroids are noted. There is no adnexal mass. Other: There is no ascites or free air. Musculoskeletal: There is no acute osseous abnormality or suspicious osseous lesion. IMPRESSION: 1. No acute finding in the abdomen or pelvis. 2. Moderate stool burden  throughout the colon. 3. Diverticulosis without evidence of acute diverticulitis. 4. Fibroid uterus. Electronically Signed   By: Valetta Mole M.D.   On: 06/22/2022 10:36    Procedures Procedures    Medications Ordered in ED Medications  iohexol (OMNIPAQUE) 300 MG/ML solution 100 mL (100 mLs Intravenous Contrast Given 06/22/22 1013)    ED Course/ Medical Decision Making/ A&P                           Medical Decision Making Amount and/or Complexity of Data Reviewed External Data Reviewed: notes.    Details: Urgent care Labs: ordered. Decision-making details documented in ED Course. Radiology: ordered and independent interpretation performed. Decision-making details documented in ED Course.  Risk Prescription drug management.  Pt  presenting today with a complaint that caries a high risk for morbidity and mortality.  Here today with ongoing abdominal pain that is been in the lower abdomen now for 2 weeks.  Does seem to be helped by the Bentyl she  received yesterday but no prior history of similar symptoms.  Urgent care records were reviewed.  Patient denies any urinary symptoms and low suspicion for UTI or pyelonephritis.  However possibility for renal stone versus diverticulitis versus functional abdominal pain.  Given patient's persistent symptoms we will do a scan to ensure no acute findings.  No symptoms to suggest C. difficile at this time.  11:23 AM I independently interpreted patient's labs today and CBC and CMP are within normal limits. I have independently visualized and interpreted pt's images today.  CT of the abdomen pelvis showed no evidence of renal stones, hydronephrosis or obstruction.  Radiology reports no acute findings.  She does have a moderate stool burden but no other acute issues.  Findings discussed with the patient.  Discussed with her using the dicyclomine as needed.  Also discussed increasing fiber in her diet and trying something like Dulcolax or Colace as needed if she becomes constipated.  Discussed with following up with GI in the future if her symptoms do not completely resolve.  No indication for admission at this time the patient was discharged home in good condition.          Final Clinical Impression(s) / ED Diagnoses Final diagnoses:  Bilateral lower abdominal cramping    Rx / DC Orders ED Discharge Orders     None         Blanchie Dessert, MD 06/22/22 1124

## 2022-06-22 NOTE — Discharge Instructions (Addendum)
The CAT scan and the blood work look normal today.  There is no sign of masses or anything else unusual on your CAT scan.  It is okay to take the medicine they gave you yesterday as needed for cramping and bloating.  Take fiber daily.  In the future if you feel constipated you can try Colace or Dulcolax which may help relieve your constipation more gently than MiraLAX which causes you to have diarrhea.  If you start having severe pain, fever and vomiting return to the emergency room

## 2022-12-12 IMAGING — DX DG CHEST 2V
2 series · 2 of 2 positions shown · non-contrast
Comparison: January 05, 2022.

CLINICAL DATA: Cough.

EXAM:
CHEST - 2 VIEW

[chest pa]
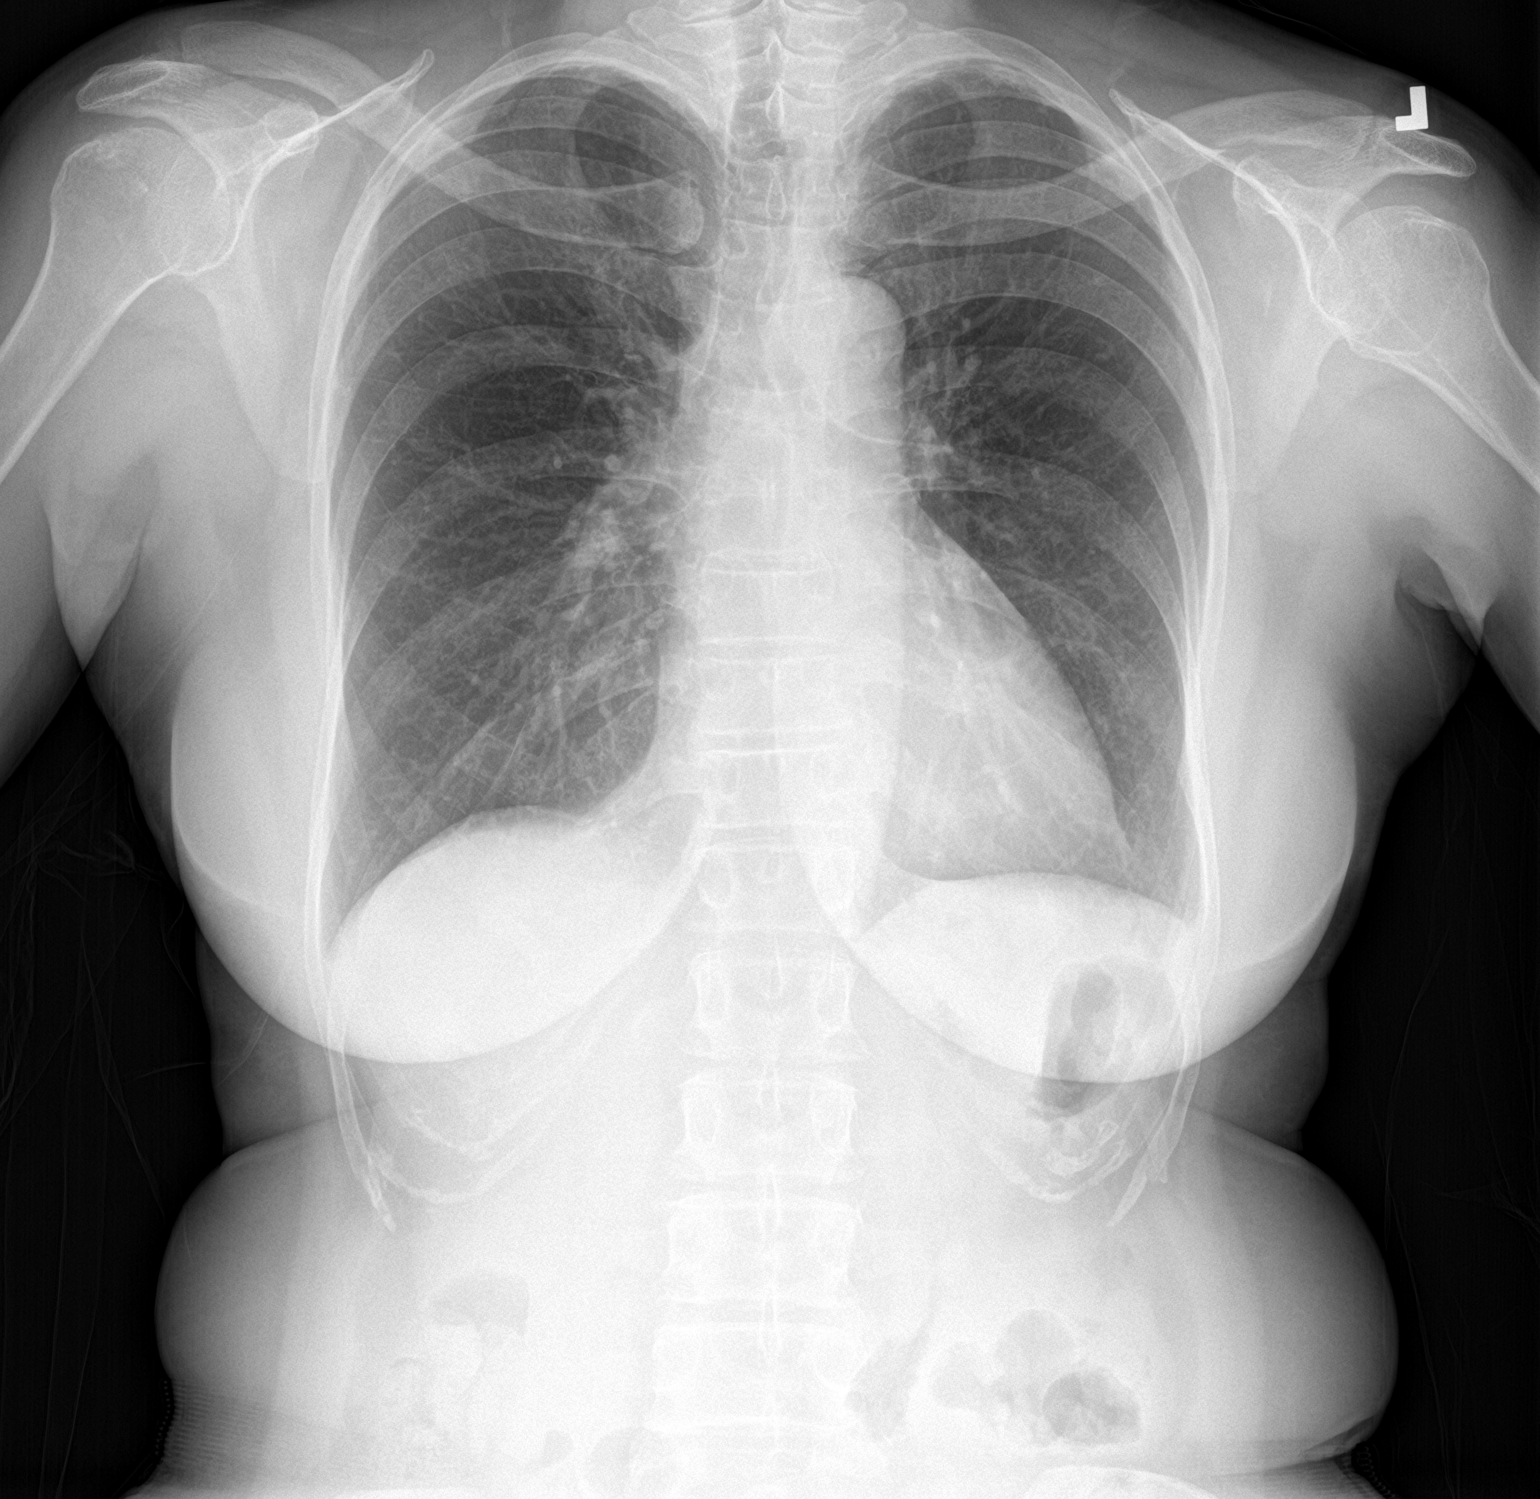

[chest lat]
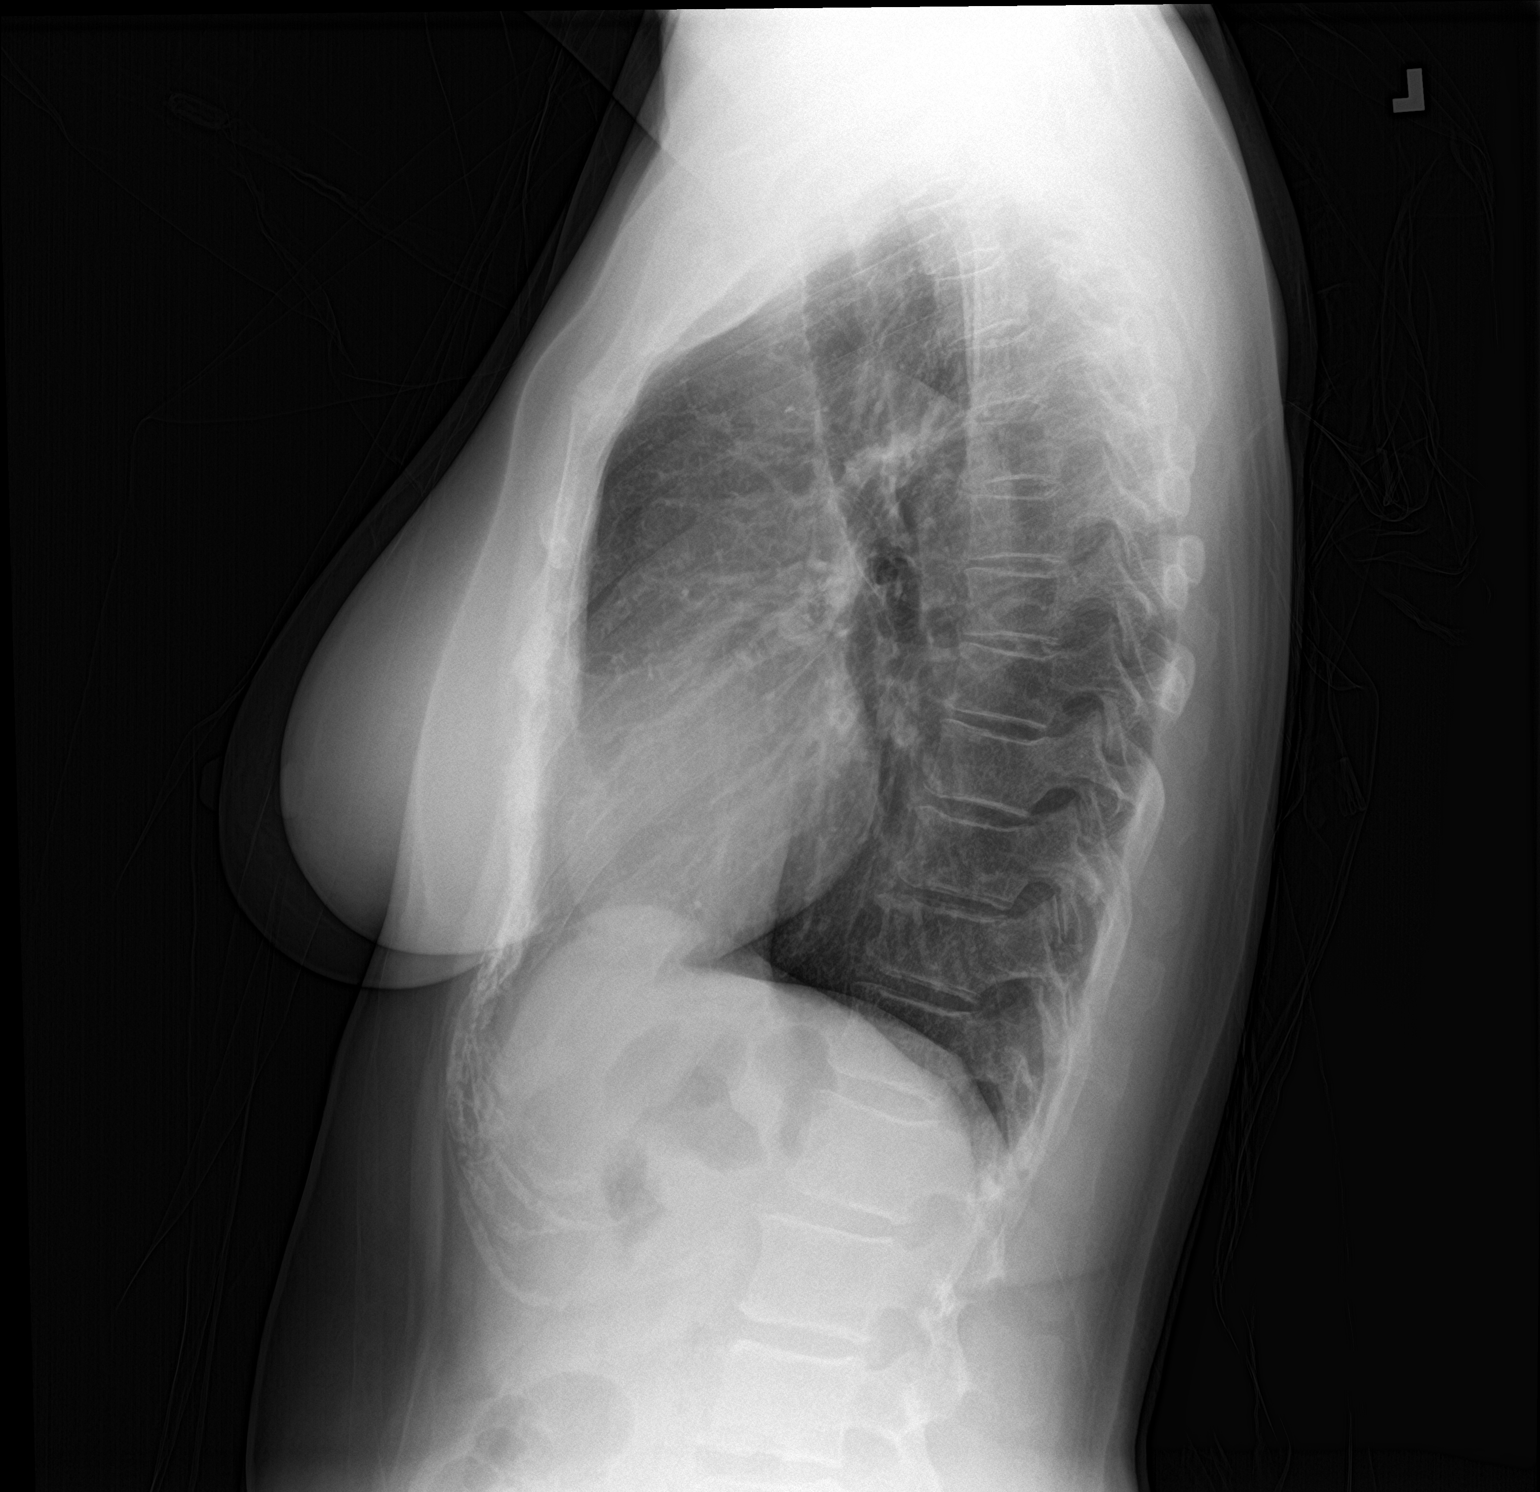

[2 of 2 positions shown; findings below may reference images not displayed]

FINDINGS: The heart size and mediastinal contours are within normal limits.
Both lungs are clear. The visualized skeletal structures are
unremarkable.
IMPRESSION: No active cardiopulmonary disease.

## 2022-12-17 IMAGING — DX DG CHEST 2V
2 series · 2 of 2 positions shown · non-contrast
Comparison: Chest x-ray 02/07/2022

CLINICAL DATA: cough

EXAM:
CHEST - 2 VIEW

[chest pa]
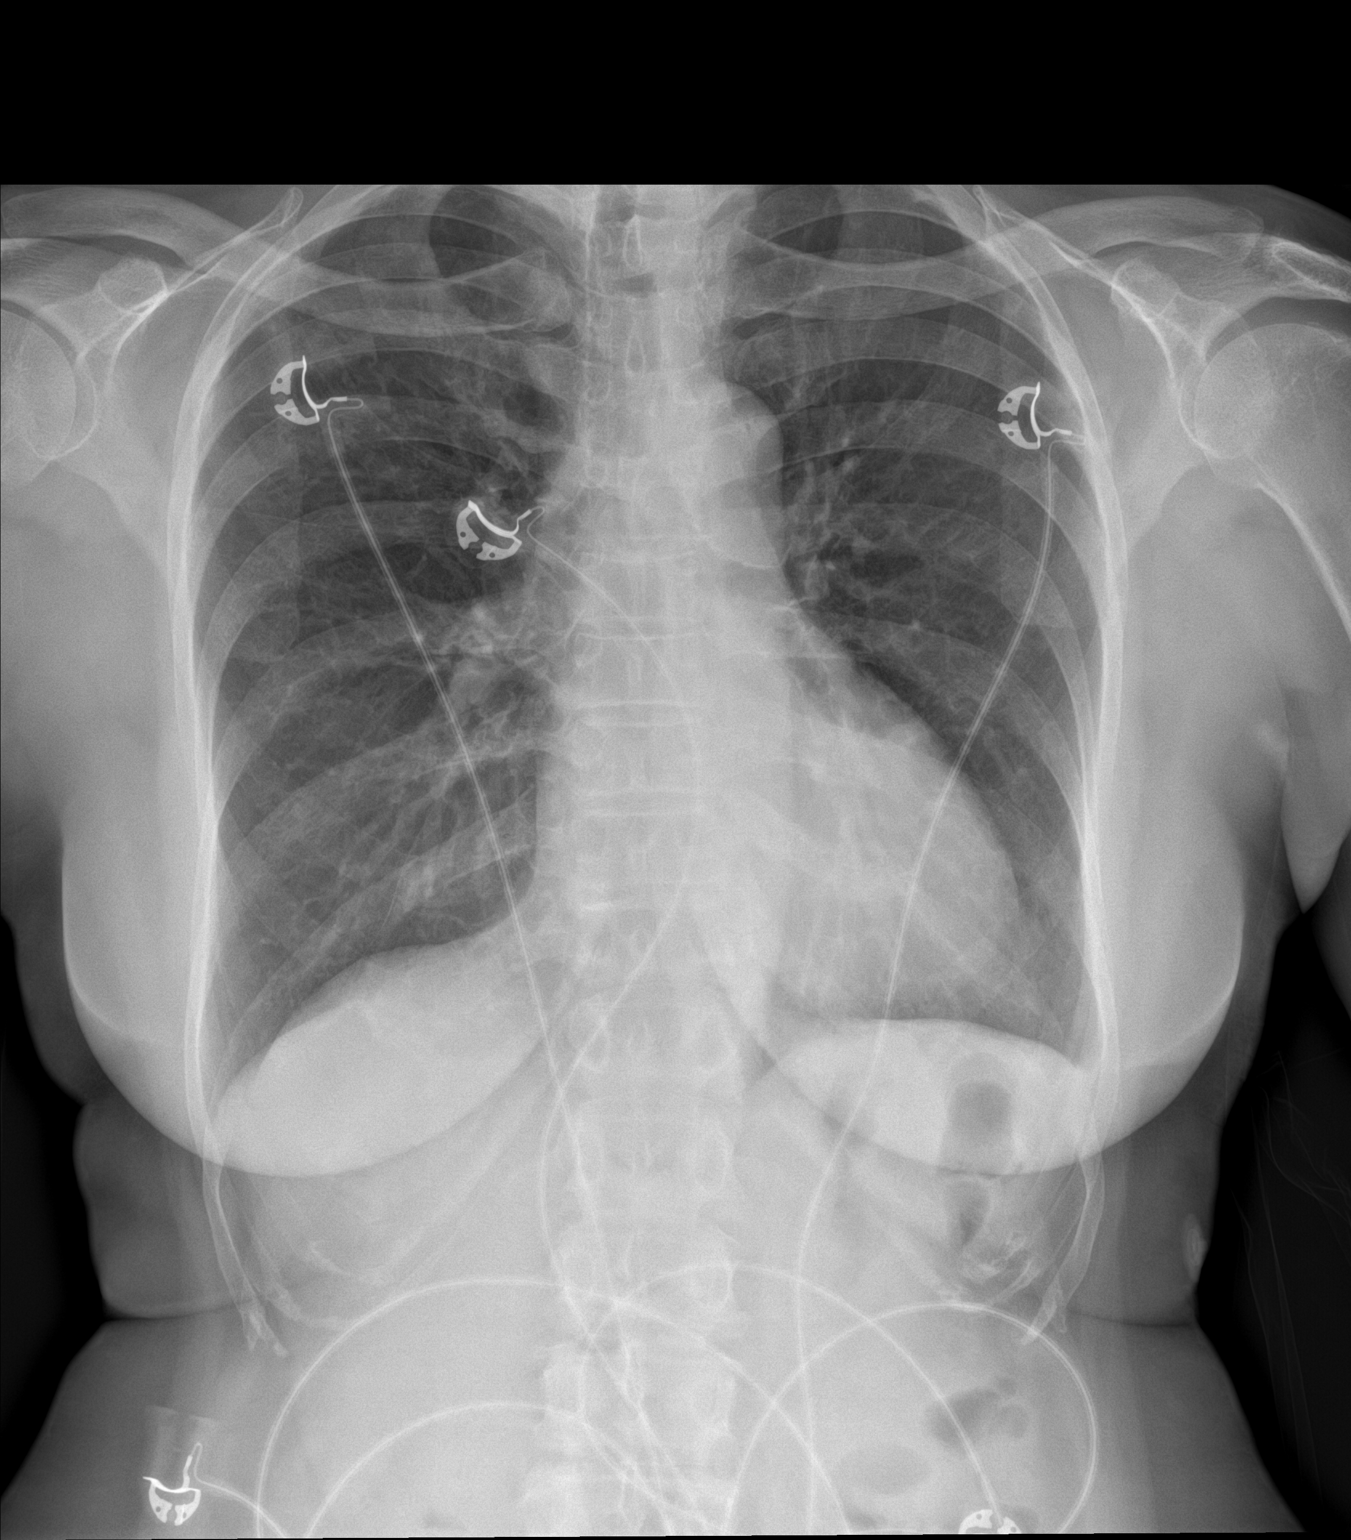

[chest lat]
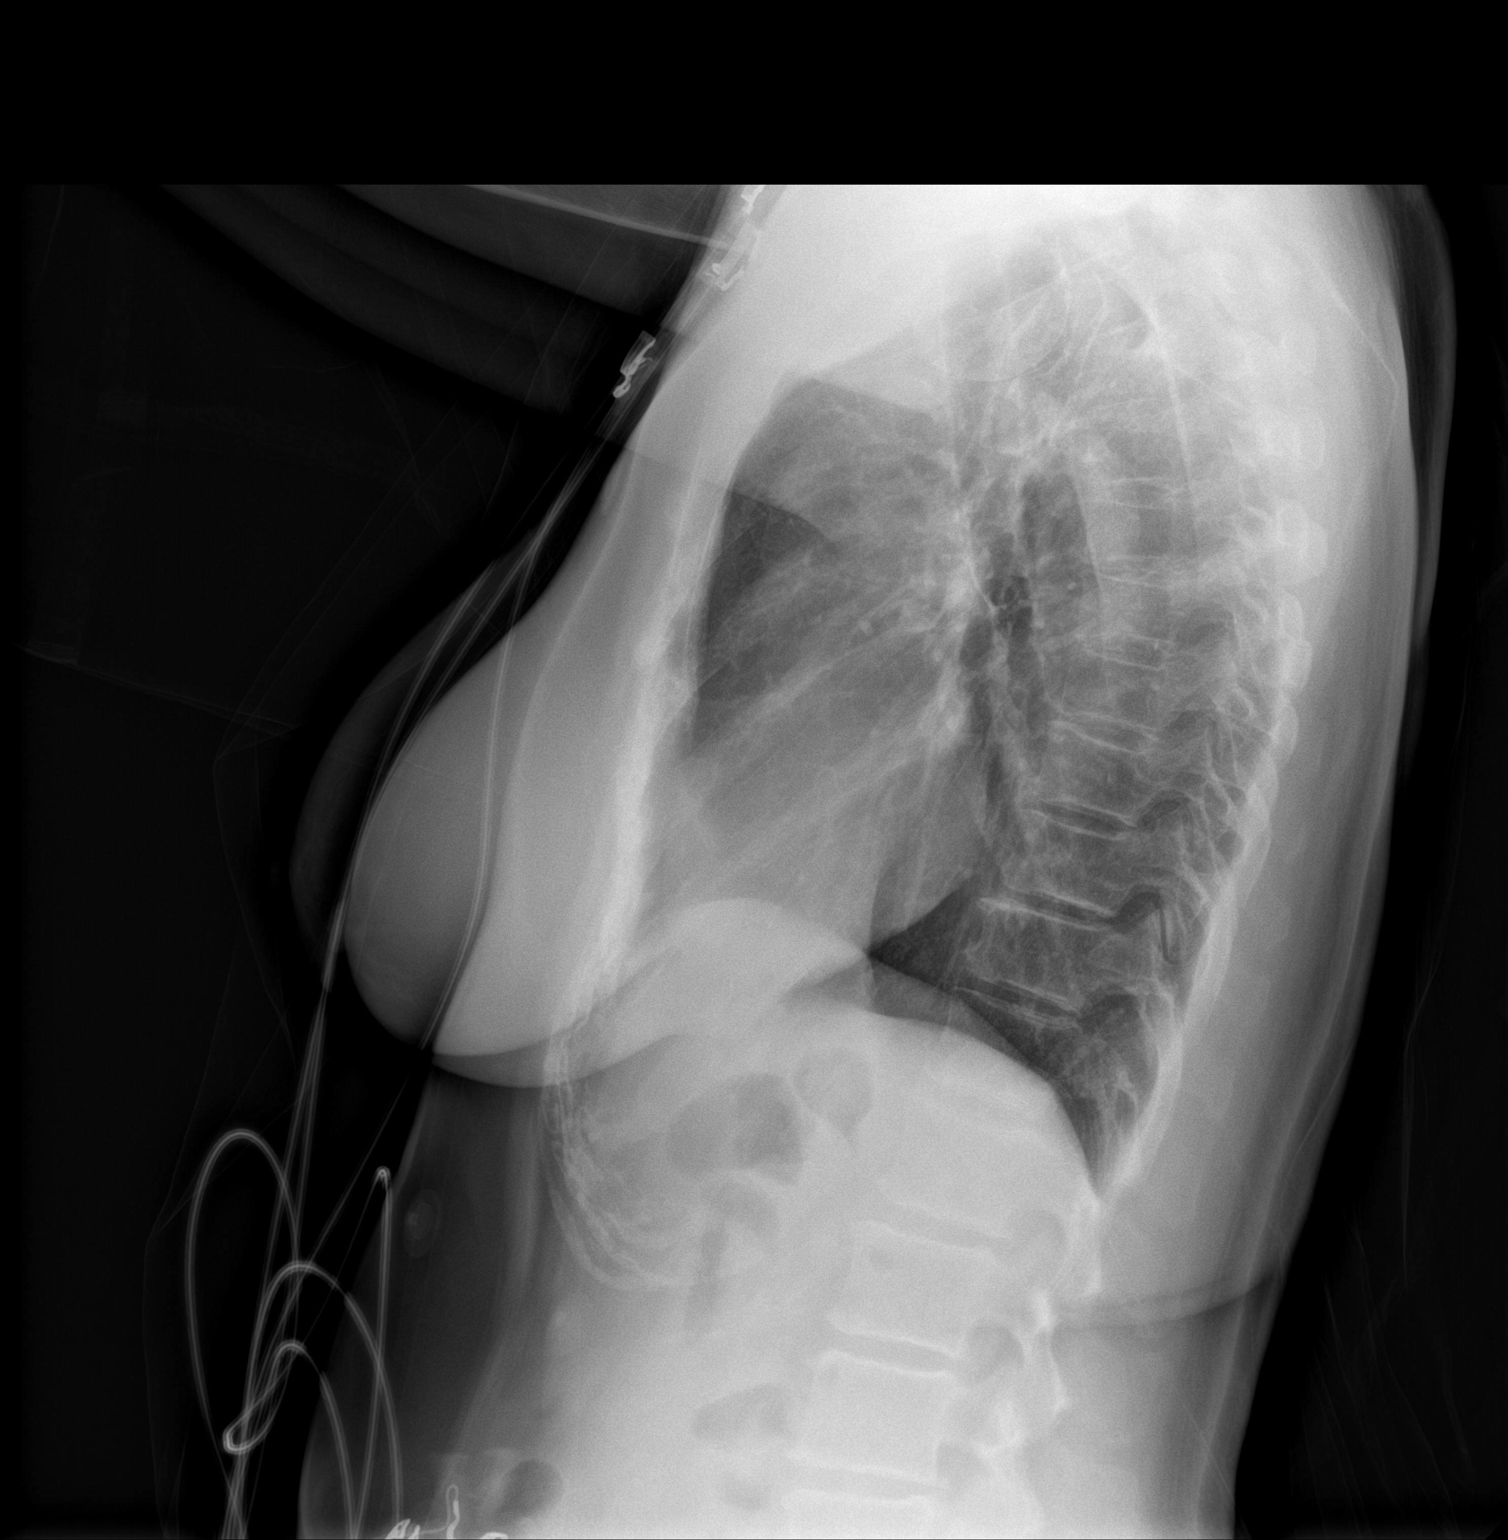

[2 of 2 positions shown; findings below may reference images not displayed]

FINDINGS: The heart and mediastinal contours are within normal limits.

Biapical pleural/pulmonary scarring. No focal consolidation. No
pulmonary edema. No pleural effusion. No pneumothorax.

No acute osseous abnormality.
IMPRESSION: No active cardiopulmonary disease.

## 2023-02-14 ENCOUNTER — Ambulatory Visit
Admission: EM | Admit: 2023-02-14 | Discharge: 2023-02-14 | Disposition: A | Discharge status: Home / Self Care | Payer: Medicare PPO | Attending: Family Medicine | Admitting: Family Medicine

## 2023-02-14 DIAGNOSIS — M62830 Muscle spasm of back: Secondary | ICD-10-CM

## 2023-02-14 DIAGNOSIS — M79604 Pain in right leg: Secondary | ICD-10-CM

## 2023-02-14 MED ORDER — METHOCARBAMOL 500 MG PO TABS: 500.0000 mg | ORAL_TABLET | Freq: Every day | ORAL | 0 refills | Status: DC | PRN

## 2023-02-14 MED ORDER — CELECOXIB 200 MG PO CAPS: 200.0000 mg | ORAL_CAPSULE | Freq: Every day | ORAL | 0 refills | Status: AC

## 2023-02-14 NOTE — ED Triage Notes (Signed)
Pt c/o RT leg tingling x 12 days. Epsom salt and aleve prn. Hx of arthritis.

## 2023-02-14 NOTE — Discharge Instructions (Addendum)
Advised patient to take medications as directed with food to completion.  Advised may take Robaxin daily or as needed for accompanying muscle spasms of lower back.  Encouraged increase daily water intake to 64 ounces per day while taking these medications.  Advised if symptoms worsen and/or unresolved please follow-up PCP or here for further evaluation.

## 2023-02-14 NOTE — ED Provider Notes (Signed)
[facility]    CSN: [id number] Arrival date & time: 02/14/23  0900      History   Chief Complaint No chief complaint on file.   HPI Marissa Green is a 72 y.o. female.   HPI 72 year old female presents with right leg tingling for 12 days.  PMH significant for glaucoma, GERD, and thyroid disease.  Past Medical History:  Diagnosis Date   Anemia    with pregnancy   Anxiety    Glaucoma    Thyroid disease     There are no problems to display for this patient.   Past Surgical History:  Procedure Laterality Date   COLONOSCOPY     MINI SHUNT INSERTION Left 12/03/2013   Procedure: INSERTION OF MINI SHUNT LEFT EYE;  Surgeon: Chalmers Guest, MD;  Location: [facility] OR;  Service: Ophthalmology;  Laterality: Left;   MITOMYCIN C APPLICATION Right 02/17/2015   Procedure: MITOMYCIN C APPLICATION RIGHT EYE;  Surgeon: Chalmers Guest, MD;  Location: [facility] OR;  Service: Ophthalmology;  Laterality: Right;   TRABECULECTOMY Right 02/17/2015   Procedure: TRABECULECTOMY RIGHT EYE;  Surgeon: Chalmers Guest, MD;  Location: [facility] OR;  Service: Ophthalmology;  Laterality: Right;    OB History   No obstetric history on file.      Home Medications    Prior to Admission medications   Medication Sig Start Date End Date Taking? Authorizing Provider  celecoxib (CELEBREX) 200 MG capsule Take 1 capsule (200 mg total) by mouth daily for 10 days. 02/14/23 02/24/23 Yes Trevor Iha, FNP  methocarbamol (ROBAXIN) 500 MG tablet Take 1 tablet (500 mg total) by mouth daily as needed for muscle spasms. 02/14/23  Yes Trevor Iha, FNP  B Complex-C (B-COMPLEX WITH VITAMIN C) tablet Take 1 tablet by mouth daily.    [provider]  betamethasone, augmented, (DIPROLENE) 0.05 % lotion SMARTSIG:Topical Daily on Weekdays 01/30/22   [provider]  busPIRone (BUSPAR) 5 MG tablet SMARTSIG:1 Tablet(s) By Mouth Morning-Night 12/01/21   [provider]  Cholecalciferol (VITAMIN D3) 10 MCG (400 UNIT)  tablet Take by mouth.    [provider]  dicyclomine (BENTYL) 20 MG tablet Take 1 tablet (20 mg total) by mouth 2 (two) times daily. 06/21/22   Trevor Iha, FNP  doxycycline (VIBRAMYCIN) 100 MG capsule Take one cap PO Q12hr with food. 02/07/22   Lattie Haw, MD  Guaifenesin 1200 MG TB12 Take 1 tablet (1,200 mg total) by mouth 2 (two) times daily at 10 AM and 5 PM. 02/12/22   Linwood Dibbles, MD  meclizine (ANTIVERT) 25 MG tablet Take 25 mg by mouth 3 (three) times daily as needed. 10/14/21   [provider]  methimazole (TAPAZOLE) 5 MG tablet TAKE 1/2 (ONE-HALF) TABLET BY MOUTH ONCE DAILY 05/27/19   [provider]  Multiple Vitamins-Minerals (MULTIVITAMIN WITH MINERALS) tablet Take 1 tablet by mouth daily.    [provider]  pantoprazole (PROTONIX) 20 MG tablet Take 1 tablet (20 mg total) by mouth daily for 10 days. 02/12/22 02/22/22  Linwood Dibbles, MD  timolol (TIMOPTIC) 0.5 % ophthalmic solution Place 1 drop into the left eye every morning. 11/17/21   [provider]  VYZULTA 0.024 % SOLN SMARTSIG:1 Drop(s) In Eye(s) Every Evening 11/27/21   [provider]    Family History Family History  Problem Relation Age of Onset   Glaucoma Mother    Heart murmur Mother    Glaucoma Father     Social History Social History   Tobacco  Use   Smoking status: Never   Smokeless tobacco: Never  Vaping Use   Vaping Use: Never used  Substance Use Topics   Alcohol use: No   Drug use: No     Allergies   Molds & smuts   Review of Systems Review of Systems  Musculoskeletal:        Right leg tingling for 12 days.  Reports history of arthritis.  All other systems reviewed and are negative.    Physical Exam Triage Vital Signs ED Triage Vitals  Enc Vitals Group     BP 02/14/23 0925 125/73     Pulse Rate 02/14/23 0925 76     Resp 02/14/23 0925 17     Temp 02/14/23 0925 98 F (36.7 C)     Temp Source 02/14/23 0925 Oral     SpO2 02/14/23 0925  98 %     Weight --      Height --      Head Circumference --      Peak Flow --      Pain Score 02/14/23 0927 0     Pain Loc --      Pain Edu? --      Excl. in GC? --    No data found.  Updated Vital Signs BP 125/73 (BP Location: Right Arm)   Pulse 76   Temp 98 F (36.7 C) (Oral)   Resp 17   SpO2 98%     Physical Exam Vitals and nursing note reviewed.  Constitutional:      Appearance: Normal appearance. She is normal weight.  HENT:     Head: Normocephalic and atraumatic.     Mouth/Throat:     Mouth: Mucous membranes are moist.     Pharynx: Oropharynx is clear.  Eyes:     Extraocular Movements: Extraocular movements intact.     Conjunctiva/sclera: Conjunctivae normal.     Pupils: Pupils are equal, round, and reactive to light.  Cardiovascular:     Rate and Rhythm: Normal rate and regular rhythm.     Pulses: Normal pulses.     Heart sounds: Normal heart sounds.  Pulmonary:     Effort: Pulmonary effort is normal.     Breath sounds: Normal breath sounds. No wheezing, rhonchi or rales.  Musculoskeletal:        General: Normal range of motion.     Cervical back: Normal range of motion and neck supple.  Skin:    General: Skin is warm and dry.  Neurological:     General: No focal deficit present.     Mental Status: She is alert and oriented to person, place, and time. Mental status is at baseline.  Psychiatric:        Mood and Affect: Mood normal.        Behavior: Behavior normal.        Thought Content: Thought content normal.      UC Treatments / Results  Labs (all labs ordered are listed, but only abnormal results are displayed) Labs Reviewed - No data to display  EKG   Radiology No results found.  Procedures Procedures (including critical care time)  Medications Ordered in UC Medications - No data to display  Initial Impression / Assessment and Plan / UC Course  I have reviewed the triage vital signs and the nursing notes.  Pertinent labs &  imaging results that were available during my care of the patient were reviewed by me and considered in my medical  decision making (see chart for details).     MDM: 1.  Pain in right leg-Rx'd Celebrex 200 mg daily x 10 days; 2.  Spasm of muscle of lower back-Rx'd Robaxin 500 mg daily, as needed. Advised patient to take medications as directed with food to completion.  Advised may take Robaxin daily or as needed for accompanying muscle spasms of lower back.  Encouraged increase daily water intake to 64 ounces per day while taking these medications.  Advised if symptoms worsen and/or unresolved please follow-up PCP or here for further evaluation.  Final Clinical Impressions(s) / UC Diagnoses   Final diagnoses:  Pain in right leg  Spasm of muscle of lower back     Discharge Instructions      Advised patient to take medications as directed with food to completion.  Advised may take Robaxin daily or as needed for accompanying muscle spasms of lower back.  Encouraged increase daily water intake to 64 ounces per day while taking these medications.  Advised if symptoms worsen and/or unresolved please follow-up PCP or here for further evaluation.     ED Prescriptions     Medication Sig Dispense Auth. Provider   celecoxib (CELEBREX) 200 MG capsule Take 1 capsule (200 mg total) by mouth daily for 10 days. 10 capsule Trevor Iha, FNP   methocarbamol (ROBAXIN) 500 MG tablet Take 1 tablet (500 mg total) by mouth daily as needed for muscle spasms. 20 tablet Trevor Iha, FNP      PDMP not reviewed this encounter.   Trevor Iha, FNP 02/14/23 1054

## 2023-08-17 LAB — COLOGUARD: COLOGUARD: NEGATIVE

## 2023-09-21 ENCOUNTER — Ambulatory Visit
Admission: EM | Admit: 2023-09-21 | Discharge: 2023-09-21 | Disposition: A | Discharge status: Home / Self Care | Payer: Medicare PPO | Attending: Family Medicine | Admitting: Family Medicine

## 2023-09-21 ENCOUNTER — Encounter: Payer: Self-pay | Admitting: Emergency Medicine

## 2023-09-21 ENCOUNTER — Other Ambulatory Visit: Payer: Self-pay

## 2023-09-21 DIAGNOSIS — M62838 Other muscle spasm: Secondary | ICD-10-CM | POA: Diagnosis not present

## 2023-09-21 DIAGNOSIS — M47812 Spondylosis without myelopathy or radiculopathy, cervical region: Secondary | ICD-10-CM

## 2023-09-21 DIAGNOSIS — H00021 Hordeolum internum right upper eyelid: Secondary | ICD-10-CM | POA: Diagnosis not present

## 2023-09-21 MED ORDER — CEPHALEXIN 500 MG PO CAPS: 500.0000 mg | ORAL_CAPSULE | Freq: Two times a day (BID) | ORAL | 0 refills | Status: AC

## 2023-09-21 MED ORDER — HYDROCODONE BIT-HOMATROP MBR 5-1.5 MG/5ML PO SOLN: 5.0000 mL | Freq: Four times a day (QID) | ORAL | 0 refills | Status: AC | PRN

## 2023-09-21 MED ORDER — BACLOFEN 10 MG PO TABS: 10.0000 mg | ORAL_TABLET | Freq: Three times a day (TID) | ORAL | 0 refills | Status: AC

## 2023-09-21 NOTE — ED Triage Notes (Signed)
Patient c/o left sided neck soreness that radiates to her upper back x 5 days.  No injury.  Patient has taken Aleve and applied alcohol.  Also patient has a bump on her right upper eyelid, soreness and redness.  Patient noticed it this morning.

## 2023-09-21 NOTE — ED Provider Notes (Signed)
[facility]    CSN: [id number] Arrival date & time: 09/21/23  0803      History   Chief Complaint Chief Complaint  Patient presents with   Torticollis    HPI Marissa Green is a 73 y.o. female.   HPI 73 year old female presents with left-sided neck pain x 5 days.  Patient reports taking OTC Aleve and applying alcohol to neck.  Additionally patient reports of small bump on right upper eyelid reports soreness and redness.  PMH significant for torticollis, glaucoma, and thyroid disease.  Past Medical History:  Diagnosis Date   Anemia    with pregnancy   Anxiety    Glaucoma    Thyroid disease     There are no active problems to display for this patient.   Past Surgical History:  Procedure Laterality Date   COLONOSCOPY     MINI SHUNT INSERTION Left 12/03/2013   Procedure: INSERTION OF MINI SHUNT LEFT EYE;  Surgeon: Chalmers Guest, MD;  Location: [facility] OR;  Service: Ophthalmology;  Laterality: Left;   MITOMYCIN C APPLICATION Right 02/17/2015   Procedure: MITOMYCIN C APPLICATION RIGHT EYE;  Surgeon: Chalmers Guest, MD;  Location: [facility] OR;  Service: Ophthalmology;  Laterality: Right;   TRABECULECTOMY Right 02/17/2015   Procedure: TRABECULECTOMY RIGHT EYE;  Surgeon: Chalmers Guest, MD;  Location: [facility] OR;  Service: Ophthalmology;  Laterality: Right;    OB History   No obstetric history on file.      Home Medications    Prior to Admission medications   Medication Sig Start Date End Date Taking? Authorizing Provider  B Complex-C (B-COMPLEX WITH VITAMIN C) tablet Take 1 tablet by mouth daily.   Yes [provider]  baclofen (LIORESAL) 10 MG tablet Take 1 tablet (10 mg total) by mouth 3 (three) times daily. 09/21/23  Yes Trevor Iha, FNP  betamethasone, augmented, (DIPROLENE) 0.05 % lotion SMARTSIG:Topical Daily on Weekdays 01/30/22  Yes [provider]  busPIRone (BUSPAR) 5 MG tablet SMARTSIG:1 Tablet(s) By Mouth Morning-Night 12/01/21  Yes [provider]  cephALEXin (KEFLEX) 500 MG capsule Take 1 capsule (500 mg total) by mouth 2 (two) times daily for 7 days. 09/21/23 09/28/23 Yes Trevor Iha, FNP  Cholecalciferol (VITAMIN D3) 10 MCG (400 UNIT) tablet Take by mouth.   Yes [provider]  dicyclomine (BENTYL) 20 MG tablet Take 1 tablet (20 mg total) by mouth 2 (two) times daily. 06/21/22  Yes Trevor Iha, FNP  Guaifenesin 1200 MG TB12 Take 1 tablet (1,200 mg total) by mouth 2 (two) times daily at 10 AM and 5 PM. 02/12/22  Yes Linwood Dibbles, MD  HYDROcodone bit-homatropine (HYCODAN) 5-1.5 MG/5ML syrup Take 5 mLs by mouth every 6 (six) hours as needed for cough. 09/21/23  Yes Trevor Iha, FNP  meclizine (ANTIVERT) 25 MG tablet Take 25 mg by mouth 3 (three) times daily as needed. 10/14/21  Yes [provider]  methimazole (TAPAZOLE) 5 MG tablet TAKE 1/2 (ONE-HALF) TABLET BY MOUTH ONCE DAILY 05/27/19  Yes [provider]  Multiple Vitamins-Minerals (MULTIVITAMIN WITH MINERALS) tablet Take 1 tablet by mouth daily.   Yes [provider]  timolol (TIMOPTIC) 0.5 % ophthalmic solution Place 1 drop into the left eye every morning. 11/17/21  Yes [provider]  VYZULTA 0.024 % SOLN SMARTSIG:1 Drop(s) In Eye(s) Every Evening 11/27/21  Yes [provider]  pantoprazole (PROTONIX) 20 MG tablet Take 1 tablet (20 mg total) by mouth daily for 10 days. 02/12/22 02/22/22  Linwood Dibbles, MD  Family History Family History  Problem Relation Age of Onset   Glaucoma Mother    Heart murmur Mother    Glaucoma Father     Social History Social History   Tobacco Use   Smoking status: Never   Smokeless tobacco: Never  Vaping Use   Vaping status: Never Used  Substance Use Topics   Alcohol use: No   Drug use: No     Allergies   Molds & smuts   Review of Systems Review of Systems  Musculoskeletal:  Positive for back pain and neck pain.     Physical Exam Triage Vital Signs ED Triage Vitals   Encounter Vitals Group     BP      Systolic BP Percentile      Diastolic BP Percentile      Pulse      Resp      Temp      Temp src      SpO2      Weight      Height      Head Circumference      Peak Flow      Pain Score      Pain Loc      Pain Education      Exclude from Growth Chart    No data found.  Updated Vital Signs BP (!) 145/74 (BP Location: Right Arm)   Pulse 67   Temp 98.2 F (36.8 C) (Oral)   Resp 16   Ht 5\' 3"  (1.6 m)   Wt 140 lb (63.5 kg)   SpO2 99%   BMI 24.80 kg/m    Physical Exam Vitals and nursing note reviewed.  Constitutional:      Appearance: Normal appearance. She is normal weight.  HENT:     Head: Normocephalic and atraumatic.     Mouth/Throat:     Mouth: Mucous membranes are moist.     Pharynx: Oropharynx is clear.  Eyes:     Extraocular Movements: Extraocular movements intact.     Conjunctiva/sclera: Conjunctivae normal.     Pupils: Pupils are equal, round, and reactive to light.     Comments: Right upper eyelid: Tiny erythematous papule resembles wart Zolam/blocked meibomian gland  Cardiovascular:     Rate and Rhythm: Normal rate and regular rhythm.     Pulses: Normal pulses.     Heart sounds: Normal heart sounds.  Pulmonary:     Effort: Pulmonary effort is normal.     Breath sounds: Normal breath sounds. No wheezing, rhonchi or rales.  Musculoskeletal:        General: Normal range of motion.     Cervical back: Normal range of motion and neck supple.     Comments: Posterior inferior cervical spine (right sided): TTP over paraspinous muscles, multiple palpable muscle adhesions noted no deformity noted  Skin:    General: Skin is warm and dry.  Neurological:     General: No focal deficit present.     Mental Status: She is alert and oriented to person, place, and time. Mental status is at baseline.  Psychiatric:        Mood and Affect: Mood normal.        Behavior: Behavior normal.        Thought Content: Thought content  normal.      UC Treatments / Results  Labs (all labs ordered are listed, but only abnormal results are displayed) Labs Reviewed - No data to display  EKG  Radiology No results found.  Procedures Procedures (including critical care time)  Medications Ordered in UC Medications - No data to display  Initial Impression / Assessment and Plan / UC Course  I have reviewed the triage vital signs and the nursing notes.  Pertinent labs & imaging results that were available during my care of the patient were reviewed by me and considered in my medical decision making (see chart for details).     MDM: 1.  Cervical spondylosis without myelopathy-Rx'd Norco 5/325 mg tablet: Take 1 tablet every 6 hours, as needed for cervical pain; 2.  Muscle spasm of neck; 2.  Muscle spasms of neck-Rx'd baclofen 10 mg tablet: Take 1 tablet 3 times daily, as needed; 3.  Hordeolum internum of right upper eyelid-Rx'd Keflex 500 mg capsule: Take 1 capsule twice daily x 7 days. Advised patient to take medication as directed with food, as needed.  Advised may use baclofen daily or as needed for accompanying muscle spasms of neck.  Encouraged increased daily water intake to 64 ounces per day while taking these medications.  Advised if symptoms worsen and/or unresolved please follow-up with PCP or here for further evaluation. Final Clinical Impressions(s) / UC Diagnoses   Final diagnoses:  Cervical spondylosis without myelopathy  Muscle spasms of neck  Hordeolum internum of right upper eyelid     Discharge Instructions      Advised patient to take medication as directed with food, as needed.  Advised may use baclofen daily or as needed for accompanying muscle spasms of neck.  Encouraged increased daily water intake to 64 ounces per day while taking these medications.  Advised if symptoms worsen and/or unresolved please follow-up with PCP or here for further evaluation.     ED Prescriptions     Medication  Sig Dispense Auth. Provider   cephALEXin (KEFLEX) 500 MG capsule Take 1 capsule (500 mg total) by mouth 2 (two) times daily for 7 days. 14 capsule Trevor Iha, FNP   HYDROcodone bit-homatropine (HYCODAN) 5-1.5 MG/5ML syrup Take 5 mLs by mouth every 6 (six) hours as needed for cough. 15 mL Trevor Iha, FNP   baclofen (LIORESAL) 10 MG tablet Take 1 tablet (10 mg total) by mouth 3 (three) times daily. 30 each Trevor Iha, FNP      I have reviewed the PDMP during this encounter.   Trevor Iha, FNP 09/21/23 [phone]

## 2023-09-21 NOTE — Discharge Instructions (Addendum)
Advised patient to take medication as directed with food, as needed.  Advised may use baclofen daily or as needed for accompanying muscle spasms of neck.  Encouraged increased daily water intake to 64 ounces per day while taking these medications.  Advised if symptoms worsen and/or unresolved please follow-up with PCP or here for further evaluation.

## 2024-03-19 ENCOUNTER — Emergency Department (HOSPITAL_COMMUNITY)
Admission: EM | Admit: 2024-03-19 | Discharge: 2024-03-19 | Disposition: A | Discharge status: Home / Self Care | Attending: Emergency Medicine | Admitting: Emergency Medicine

## 2024-03-19 ENCOUNTER — Encounter (HOSPITAL_COMMUNITY): Payer: Self-pay

## 2024-03-19 ENCOUNTER — Other Ambulatory Visit: Payer: Self-pay

## 2024-03-19 ENCOUNTER — Emergency Department (HOSPITAL_COMMUNITY)

## 2024-03-19 DIAGNOSIS — Y92481 Parking lot as the place of occurrence of the external cause: Secondary | ICD-10-CM | POA: Insufficient documentation

## 2024-03-19 DIAGNOSIS — M545 Low back pain, unspecified: Secondary | ICD-10-CM | POA: Insufficient documentation

## 2024-03-19 DIAGNOSIS — M25511 Pain in right shoulder: Secondary | ICD-10-CM | POA: Diagnosis present

## 2024-03-19 MED ORDER — IBUPROFEN 800 MG PO TABS
800.0000 mg | ORAL_TABLET | Freq: Once | ORAL | Status: AC
  Administered 2024-03-19: 800 mg via ORAL
  Filled 2024-03-19: qty 1

## 2024-03-19 MED ORDER — IBUPROFEN 600 MG PO TABS: 600.0000 mg | ORAL_TABLET | Freq: Four times a day (QID) | ORAL | 0 refills | Status: AC | PRN

## 2024-03-19 MED ORDER — METHOCARBAMOL 500 MG PO TABS: 500.0000 mg | ORAL_TABLET | Freq: Three times a day (TID) | ORAL | 0 refills | Status: AC | PRN
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
